# Patient Record
Sex: Male | Born: 1960 | Race: Black or African American | Hispanic: No | Marital: Single | Smoking: Former smoker
Health system: Southern US, Community
[De-identification: ages and names within clinical notes are randomized; demographics above are authoritative.]

## PROBLEM LIST (undated history)

## (undated) DIAGNOSIS — I639 Cerebral infarction, unspecified: Secondary | ICD-10-CM

## (undated) DIAGNOSIS — M1990 Unspecified osteoarthritis, unspecified site: Secondary | ICD-10-CM

## (undated) DIAGNOSIS — K219 Gastro-esophageal reflux disease without esophagitis: Secondary | ICD-10-CM

## (undated) DIAGNOSIS — A159 Respiratory tuberculosis unspecified: Secondary | ICD-10-CM

## (undated) DIAGNOSIS — G8929 Other chronic pain: Secondary | ICD-10-CM

## (undated) DIAGNOSIS — F1910 Other psychoactive substance abuse, uncomplicated: Secondary | ICD-10-CM

## (undated) DIAGNOSIS — I10 Essential (primary) hypertension: Secondary | ICD-10-CM

## (undated) DIAGNOSIS — E559 Vitamin D deficiency, unspecified: Secondary | ICD-10-CM

## (undated) DIAGNOSIS — E785 Hyperlipidemia, unspecified: Secondary | ICD-10-CM

## (undated) HISTORY — DX: Gastro-esophageal reflux disease without esophagitis: K21.9

## (undated) HISTORY — DX: Unspecified osteoarthritis, unspecified site: M19.90

## (undated) HISTORY — DX: Respiratory tuberculosis unspecified: A15.9

## (undated) HISTORY — DX: Other psychoactive substance abuse, uncomplicated: F19.10

## (undated) HISTORY — DX: Other chronic pain: G89.29

## (undated) HISTORY — DX: Cerebral infarction, unspecified: I63.9

## (undated) HISTORY — PX: COLONOSCOPY: SHX174

---
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
## 1999-12-02 HISTORY — PX: ANKLE FRACTURE SURGERY: SHX122

## 2016-02-18 ENCOUNTER — Encounter: Payer: Self-pay | Admitting: Family Medicine

## 2016-02-18 ENCOUNTER — Ambulatory Visit (INDEPENDENT_AMBULATORY_CARE_PROVIDER_SITE_OTHER): Payer: Medicare Other | Admitting: Family Medicine

## 2016-02-18 VITALS — BP 120/75 | HR 59 | Temp 97.7°F | Ht 72.0 in | Wt 183.0 lb

## 2016-02-18 DIAGNOSIS — M25579 Pain in unspecified ankle and joints of unspecified foot: Secondary | ICD-10-CM | POA: Insufficient documentation

## 2016-02-18 DIAGNOSIS — Z1211 Encounter for screening for malignant neoplasm of colon: Secondary | ICD-10-CM | POA: Diagnosis not present

## 2016-02-18 DIAGNOSIS — Z0000 Encounter for general adult medical examination without abnormal findings: Secondary | ICD-10-CM | POA: Diagnosis not present

## 2016-02-18 DIAGNOSIS — M25572 Pain in left ankle and joints of left foot: Secondary | ICD-10-CM | POA: Diagnosis not present

## 2016-02-18 DIAGNOSIS — Z7689 Persons encountering health services in other specified circumstances: Secondary | ICD-10-CM

## 2016-02-18 DIAGNOSIS — Z7189 Other specified counseling: Secondary | ICD-10-CM | POA: Diagnosis not present

## 2016-02-18 LAB — LIPID PANEL
Cholesterol: 200 mg/dL (ref 125–200)
HDL: 43 mg/dL (ref >=40)
LDL CALC: 135 mg/dL — AB (ref <130)
TRIGLYCERIDES: 109 mg/dL (ref <150)
Total CHOL/HDL Ratio: 4.7 Ratio (ref <=5.0)
VLDL: 22 mg/dL (ref <30)

## 2016-02-18 LAB — CBC WITH DIFFERENTIAL/PLATELET
BASOS ABS: 0.1 10*3/uL (ref 0.0–0.1)
Basophils Relative: 2 % — ABNORMAL HIGH (ref 0–1)
Eosinophils Absolute: 0.1 10*3/uL (ref 0.0–0.7)
Eosinophils Relative: 3 % (ref 0–5)
HEMATOCRIT: 42 % (ref 39.0–52.0)
HEMOGLOBIN: 13.7 g/dL (ref 13.0–17.0)
LYMPHS PCT: 49 % — AB (ref 12–46)
Lymphs Abs: 1.7 10*3/uL (ref 0.7–4.0)
MCH: 27.3 pg (ref 26.0–34.0)
MCHC: 32.6 g/dL (ref 30.0–36.0)
MCV: 83.7 fL (ref 78.0–100.0)
MPV: 8.9 fL (ref 8.6–12.4)
Monocytes Absolute: 0.4 10*3/uL (ref 0.1–1.0)
Monocytes Relative: 13 % — ABNORMAL HIGH (ref 3–12)
NEUTROS ABS: 1.1 10*3/uL — AB (ref 1.7–7.7)
Neutrophils Relative %: 33 % — ABNORMAL LOW (ref 43–77)
Platelets: 333 10*3/uL (ref 150–400)
RBC: 5.02 MIL/uL (ref 4.22–5.81)
RDW: 14.7 % (ref 11.5–15.5)
WBC: 3.4 10*3/uL — AB (ref 4.0–10.5)

## 2016-02-18 LAB — COMPLETE METABOLIC PANEL WITH GFR
ALT: 31 U/L (ref 9–46)
AST: 18 U/L (ref 10–35)
Albumin: 4 g/dL (ref 3.6–5.1)
Alkaline Phosphatase: 52 U/L (ref 40–115)
BUN: 11 mg/dL (ref 7–25)
CALCIUM: 9.4 mg/dL (ref 8.6–10.3)
CHLORIDE: 102 mmol/L (ref 98–110)
CO2: 26 mmol/L (ref 20–31)
Creat: 0.88 mg/dL (ref 0.70–1.33)
GFR, Est Non African American: >89 mL/min (ref >=60)
Glucose, Bld: 98 mg/dL (ref 65–99)
POTASSIUM: 4.5 mmol/L (ref 3.5–5.3)
Sodium: 136 mmol/L (ref 135–146)
Total Bilirubin: 0.4 mg/dL (ref 0.2–1.2)
Total Protein: 7.4 g/dL (ref 6.1–8.1)

## 2016-02-18 LAB — TSH: TSH: 0.65 mIU/L (ref 0.40–4.50)
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
## 2016-02-18 NOTE — Patient Instructions (Signed)
We will let you know if anything in your bloodwork needs attention. If kidney function OK will prescribe anti-inflammatory for ankle pain. Watch fat, cholesterol and carbs in diet. Eat plenty of vegetable, fruits and lean meat that is baked, broiled, boiled, or grilled. Try to do some walking each day. Return in 6 months unless we let your know differently

## 2016-02-19 LAB — HIV ANTIBODY (ROUTINE TESTING W REFLEX): HIV 1&2 Ab, 4th Generation: NONREACTIVE

## 2016-02-19 LAB — PSA, MEDICARE: PSA: 1.73 ng/mL (ref <=4.00)

## 2016-02-20 LAB — VITAMIN D 1,25 DIHYDROXY
Vitamin D 1, 25 (OH)2 Total: 54 pg/mL (ref 18–72)
Vitamin D3 1, 25 (OH)2: 54 pg/mL

## 2016-05-27 ENCOUNTER — Emergency Department (HOSPITAL_COMMUNITY)
Admission: EM | Admit: 2016-05-27 | Discharge: 2016-05-27 | Disposition: A | Discharge status: Home / Self Care | Payer: Medicare Other | Attending: Emergency Medicine | Admitting: Emergency Medicine

## 2016-05-27 ENCOUNTER — Emergency Department (HOSPITAL_COMMUNITY): Payer: Medicare Other

## 2016-05-27 ENCOUNTER — Encounter (HOSPITAL_COMMUNITY): Payer: Self-pay

## 2016-05-27 DIAGNOSIS — W28XXXA Contact with powered lawn mower, initial encounter: Secondary | ICD-10-CM | POA: Diagnosis not present

## 2016-05-27 DIAGNOSIS — T148XXA Other injury of unspecified body region, initial encounter: Secondary | ICD-10-CM

## 2016-05-27 DIAGNOSIS — S62632B Displaced fracture of distal phalanx of right middle finger, initial encounter for open fracture: Secondary | ICD-10-CM | POA: Insufficient documentation

## 2016-05-27 DIAGNOSIS — Y9389 Activity, other specified: Secondary | ICD-10-CM | POA: Diagnosis not present

## 2016-05-27 DIAGNOSIS — Y999 Unspecified external cause status: Secondary | ICD-10-CM | POA: Insufficient documentation

## 2016-05-27 DIAGNOSIS — Z87891 Personal history of nicotine dependence: Secondary | ICD-10-CM | POA: Diagnosis not present

## 2016-05-27 DIAGNOSIS — I10 Essential (primary) hypertension: Secondary | ICD-10-CM | POA: Diagnosis not present

## 2016-05-27 DIAGNOSIS — Y929 Unspecified place or not applicable: Secondary | ICD-10-CM | POA: Insufficient documentation

## 2016-05-27 DIAGNOSIS — S6991XA Unspecified injury of right wrist, hand and finger(s), initial encounter: Secondary | ICD-10-CM | POA: Diagnosis present

## 2016-05-27 HISTORY — DX: Essential (primary) hypertension: I10

## 2016-05-27 MED ORDER — TETANUS-DIPHTH-ACELL PERTUSSIS 5-2.5-18.5 LF-MCG/0.5 IM SUSP
0.5000 mL | Freq: Once | INTRAMUSCULAR | Status: AC
  Administered 2016-05-27: 0.5 mL via INTRAMUSCULAR
  Filled 2016-05-27: qty 0.5

## 2016-05-27 MED ORDER — OXYCODONE-ACETAMINOPHEN 5-325 MG PO TABS
2.0000 | ORAL_TABLET | Freq: Once | ORAL | Status: AC
Start: 2016-05-27 — End: 2016-05-27
  Administered 2016-05-27: 2 via ORAL
  Filled 2016-05-27: qty 2

## 2016-05-27 MED ORDER — SULFAMETHOXAZOLE-TRIMETHOPRIM 800-160 MG PO TABS: 1.0000 | ORAL_TABLET | Freq: Two times a day (BID) | ORAL | Status: AC

## 2016-05-27 MED ORDER — LIDOCAINE HCL (PF) 1 % IJ SOLN
30.0000 mL | Freq: Once | INTRAMUSCULAR | Status: AC
  Administered 2016-05-27: 30 mL via INTRADERMAL
  Filled 2016-05-27: qty 30

## 2016-05-27 MED ORDER — HYDROCODONE-ACETAMINOPHEN 5-325 MG PO TABS: 1.0000 | ORAL_TABLET | ORAL | Status: DC | PRN

## 2016-05-27 MED ORDER — BUPIVACAINE HCL (PF) 0.25 % IJ SOLN
15.0000 mL | Freq: Once | INTRAMUSCULAR | Status: AC
  Administered 2016-05-27: 15 mL
  Filled 2016-05-27: qty 15
  Filled 2016-05-27: qty 30

## 2016-05-27 NOTE — ED Provider Notes (Signed)
CSN: [id number]     Arrival date & time 05/27/16  1702 History  By signing my name below, I, Ruben Rivas, attest that this documentation has been prepared under the direction and in the presence of S. Wendie Simmer, PA-C Electronically Signed: [place], ED Scribe. 05/27/2016. 6:16 PM.  Chief Complaint  Patient presents with  . Finger Injury   The history is provided by the patient. No language interpreter was used.   Ruben Rivas is a 55 y.o. male with a medical hx of HTN who presents to the Emergency Department complaining of right middle finger injury occurring PTA. Pt notes that he was cutting his grass when his lawn mower became clogged. Pt attempted to remove the debris with the lawn mower still running when his right middle finger was cut by the blade. Pt rates his right middle finger pain as 6-7/10 at this time. Pt notes that he is not UTD on his tetanus vaccination at this time. Pt is having associated symptoms of right middle finger pain, right middle finger laceration and tingling sensation to right middle finger. He notes that he has tried applying pressure without medications for the relief of his symptoms. He denies numbness and any other symptoms.    Past Medical History  Diagnosis Date  . Hypertension    Past Surgical History  Procedure Laterality Date  . Ankle fracture surgery  2001   Family History  Problem Relation Age of Onset  . Diabetes Mother   . Cancer Father    Social History  Substance Use Topics  . Smoking status: Former Research scientist (life sciences)  . Smokeless tobacco: None  . Alcohol Use: No    Review of Systems  A complete 10 system review of systems was obtained and all systems are negative except as noted in the HPI and PMH.   Allergies  Review of patient's allergies indicates no known allergies.  Home Medications   Prior to Admission medications   Not on File   BP 136/89 mmHg  Pulse 70  Temp(Src) 98.6 F (37 C)  Resp 20  Wt 188 lb 6 oz (85.446 kg)   SpO2 99% Physical Exam  Constitutional: He is oriented to person, place, and time. He appears well-developed and well-nourished. No distress.  HENT:  Head: Normocephalic and atraumatic.  Eyes: EOM are normal.  Neck: Neck supple.  Cardiovascular: Normal rate.   Pulmonary/Chest: Effort normal. No respiratory distress.  Abdominal: He exhibits no distension.  Musculoskeletal: Normal range of motion.       Right hand: He exhibits laceration. He exhibits normal range of motion. Normal sensation noted. Normal strength noted.  Right third digit: Able to flex and extend DIP. Endorses tingling at distal tuft, but endorses intact sensation to lateral and medial aspect of right third digit. See photo below.   Neurological: He is alert and oriented to person, place, and time.  Skin: Skin is warm and dry.  Psychiatric: He has a normal mood and affect. His behavior is normal.  Nursing note and vitals reviewed.       ED Course  Procedures  DIAGNOSTIC STUDIES: Oxygen Saturation is 99% on RA, nl by my interpretation.    COORDINATION OF CARE: 6:16 PM Discussed treatment plan with pt at bedside which includes right hand xray, update tetanus, and consult hand surgeon, and pt agreed to plan.  7:13 PM- consult with Nurse Assistant to hand surgeon, Dr. Fredna Dow who recommends keeping the pt NPO.   8:18 PM- Consult with hand surgeon,  Dr. Fredna Dow, who will see the pt in the ED for evaluation.  Imaging Review Dg Hand Complete Right  05/27/2016  CLINICAL DATA:  Third and fourth digit pain following lawnmower accident, initial encounter EXAM: RIGHT HAND - COMPLETE 3+ VIEW COMPARISON:  None. FINDINGS: Comminuted fracture of the third distal phalanx is noted with associated soft tissue injury. No other focal fracture is seen. No other soft tissue abnormality is noted. IMPRESSION: Fourth distal phalangeal fracture with associated soft tissue defect Electronically Signed   By: Inez Catalina M.D.   On: 05/27/2016 18:34    I have personally reviewed and evaluated these images as part of my medical decision-making. The only exception to the xray report above would be to change "fourth distal phalangeal fracture..." in the impression to third distal.    MDM   Final diagnoses:  Open fracture   Open fracture. Hand surgery consulted and will see patient bedside. Tdap updated.  After evaluation and treatment, Dr. Fredna Dow advises discharge with pain meds and bactrim.   I personally performed the services described in this documentation, which was scribed in my presence. The recorded information has been reviewed and is accurate.  [place], PA-C 06/02/16 [place], MD 06/02/16 1537

## 2016-05-27 NOTE — Discharge Instructions (Signed)
Mr. Santo Hitt,  Nice meeting you! Please follow-up with Dr. Fredna Dow. Return to the emergency department if you develop fevers, chills, nausea/vomiting, increased redness, inability to move your finger, new/worsening symptoms. Feel better soon!  S. Wendie Simmer, PA-C

## 2016-05-27 NOTE — ED Notes (Signed)
Per Pt, Pt was at home mowing the lawn when his lawn mower became clogged. Pt tried to remove the debri when his finger was chopped by the blade. Pt has laceration noted to the right middle finger. Bleeding controlled.

## 2016-05-27 NOTE — ED Notes (Signed)
Dr Kuzma at bedside. 

## 2016-05-27 NOTE — Consult Note (Signed)
Ruben Rivas is an 55 y.o. male.   Chief Complaint: right long fingertip injury HPI: 55 yo rhd male states he was trying to clear material from his lawnmower this evening when he injured his right long finger on the blade.  Seen at [facility] where XR revealed a fracture of the distal phalanx.  He reports no previous injury to the finger and no other injury at this time.  He describes a throbbing, stabbing pain of 6/10 severity.  The injury was associated with bleeding.  The pain is aggravated by palpation and relieved by nothing.   Case discussed with Farrel Gordon, PA-C and her note from 05/27/2016 reviewed. Xrays viewed and interpreted by me: 3 views right long finger show tuft fracture of distal phalanx with displacement.  No radioopaque foreign bodies. Labs reviewed: none  Allergies: No Known Allergies  Past Medical History  Diagnosis Date  . Hypertension     Past Surgical History  Procedure Laterality Date  . Ankle fracture surgery  2001    Family History: Family History  Problem Relation Age of Onset  . Diabetes Mother   . Cancer Father     Social History:   reports that he has quit smoking. He does not have any smokeless tobacco history on file. He reports that he does not drink alcohol or use illicit drugs.  Medications:  (Not in a hospital admission)  No results found for this or any previous visit (from the past 48 hour(s)).  Dg Hand Complete Right  05/27/2016  CLINICAL DATA:  Third and fourth digit pain following lawnmower accident, initial encounter EXAM: RIGHT HAND - COMPLETE 3+ VIEW COMPARISON:  None. FINDINGS: Comminuted fracture of the third distal phalanx is noted with associated soft tissue injury. No other focal fracture is seen. No other soft tissue abnormality is noted. IMPRESSION: Fourth distal phalangeal fracture with associated soft tissue defect Electronically Signed   By: Inez Catalina M.D.   On: 05/27/2016 18:34     A comprehensive review of  systems was negative. Review of Systems: No fevers, chills, night sweats, chest pain, shortness of breath, nausea, vomiting, diarrhea, constipation, easy bleeding or bruising, headaches, dizziness, vision changes, fainting.   Blood pressure 149/92, pulse 55, temperature 98.6 F (37 C), resp. rate 18, weight 85.446 kg (188 lb 6 oz), SpO2 100 %.  General appearance: alert, cooperative and appears stated age Head: Normocephalic, without obvious abnormality, atraumatic Neck: supple, symmetrical, trachea midline Extremities: Intact sensation and capillary refill all digits.  +epl/fpl/io.  Right long finger with laceration from ulnar side of distal phalanx across distal aspect of nail bed.  No other lacerations noted. Pulses: 2+ and symmetric Skin: Skin color, texture, turgor normal. No rashes or lesions Neurologic: Grossly normal Incision/Wound: As above  Assessment/Plan Right long finger open distal phalanx fracture and skin and nail bed lacerations.  Recommend irrigation and debridement, reduction, repair of skin and nail bed in ED.  Risks, benefits, and alternatives of surgery were discussed and the patient voiced understanding and consent and agrees with the plan of care.   PROCEDURE NOTE:  PROCEDURE NOTE:  Digital block performed with 63mL half and half 1% plain lidocaine and 0.25% plain marcaine.  Adequate to give digital anesthesia.  Wound and open fracture copiously irrigated with 1077mL sterile saline and betadine solution.  Finger prepped with betadine and sterilely draped.  Penrose used as a tourniquet and was up ~ 25 minutes.  Remainder of nail removed with freer elevator.  Contaminated hematoma  was removed with the pickups.  Fracture fragments were reduced.  The skin was repaired with 5-0 Monocryl suture and the nail bed repaired with 6-0 chromic suture.  Good approximation achieved.  Xeroform placed in nail fold and wounds dressed with sterile xeroform, 4x4s, and wrapped lightly with  coban dressing.  Alumafoam splint placed and wrapped lightly with coban dressing.  Penrose drain removed.  Patient tolerated procedure well.  Follow up in 1 week.  Pain meds and antibiotic prescribed by ED.   Lutricia Widjaja R 05/27/2016, 9:47 PM

## 2016-08-22 ENCOUNTER — Encounter: Payer: Self-pay | Admitting: Family Medicine

## 2016-08-22 ENCOUNTER — Ambulatory Visit (INDEPENDENT_AMBULATORY_CARE_PROVIDER_SITE_OTHER): Payer: Medicare Other | Admitting: Family Medicine

## 2016-08-22 VITALS — BP 126/82 | HR 59 | Temp 98.4°F | Resp 14 | Ht 72.0 in | Wt 191.0 lb

## 2016-08-22 DIAGNOSIS — Z1159 Encounter for screening for other viral diseases: Secondary | ICD-10-CM

## 2016-08-22 DIAGNOSIS — D72819 Decreased white blood cell count, unspecified: Secondary | ICD-10-CM

## 2016-08-22 DIAGNOSIS — Z1211 Encounter for screening for malignant neoplasm of colon: Secondary | ICD-10-CM | POA: Diagnosis not present

## 2016-08-22 DIAGNOSIS — Z23 Encounter for immunization: Secondary | ICD-10-CM

## 2016-08-22 LAB — CBC WITH DIFFERENTIAL/PLATELET
Basophils Absolute: 41 cells/uL (ref 0–200)
Basophils Relative: 1 %
EOS PCT: 4 %
Eosinophils Absolute: 164 cells/uL (ref 15–500)
HCT: 42.7 % (ref 38.5–50.0)
HEMOGLOBIN: 13.9 g/dL (ref 13.2–17.1)
LYMPHS ABS: 2050 {cells}/uL (ref 850–3900)
Lymphocytes Relative: 50 %
MCH: 26.7 pg — ABNORMAL LOW (ref 27.0–33.0)
MCHC: 32.6 g/dL (ref 32.0–36.0)
MCV: 82 fL (ref 80.0–100.0)
MPV: 9.7 fL (ref 7.5–12.5)
Monocytes Absolute: 697 cells/uL (ref 200–950)
Monocytes Relative: 17 %
NEUTROS ABS: 1148 {cells}/uL — AB (ref 1500–7800)
Neutrophils Relative %: 28 %
Platelets: 268 10*3/uL (ref 140–400)
RBC: 5.21 MIL/uL (ref 4.20–5.80)
RDW: 14.9 % (ref 11.0–15.0)
WBC: 4.1 10*3/uL (ref 3.8–10.8)

## 2016-08-22 NOTE — Progress Notes (Signed)
Ruben Rivas, is a 55 y.o. male  [id number]  [id number]  DOB - 1961/06/01  CC:  Chief Complaint  Patient presents with  . Follow-up       HPI: Nizar Friedrich is a 55 y.o. male here for follow-up hypertension. He is currently on no medications for hypertension. His BP today is126/82. His other complaints is of ankle and foot pain. He reports stopping smoking about 9 months ago, and has no alcohol and drugs in about a year.   Health Maintenance: He accepts flu shot today. . Tdap is UTD. He needs Hep C and colon cancer screening.   No Known Allergies Past Medical History:  Diagnosis Date  . Hypertension    No current outpatient prescriptions on file prior to visit.   No current facility-administered medications on file prior to visit.    Family History  Problem Relation Age of Onset  . Diabetes Mother   . Cancer Father    Social History   Social History  . Marital status: Single    Spouse name: N/A  . Number of children: N/A  . Years of education: N/A   Occupational History  . Not on file.   Social History Main Topics  . Smoking status: Former Research scientist (life sciences)  . Smokeless tobacco: Never Used  . Alcohol use No  . Drug use: No  . Sexual activity: Not on file   Other Topics Concern  . Not on file   Social History Narrative  . No narrative on file    Review of Systems: Constitutional: Negative Skin: Negative HENT: Negative  Eyes: Negative  Neck: Negative Respiratory: Negative Cardiovascular: Negative Gastrointestinal: Negative Genitourinary: Negative  Musculoskeletal: Positive for back and ankle pain.  Neurological: Negative  Hematological: Negative  Psychiatric/Behavioral: Negative    Objective:   Vitals:   08/22/16 1035  BP: 126/82  Pulse: (!) 59  Resp: 14  Temp: 98.4 F (36.9 C)    Physical Exam: Constitutional: Patient appears well-developed and well-nourished. No distress. HENT: Normocephalic, atraumatic, External right and left  ear normal. Oropharynx is clear and moist. Teeth in poor repair Eyes: Conjunctivae and EOM are normal. PERRLA, no scleral icterus. Neck: Normal ROM. Neck supple. No lymphadenopathy, No thyromegaly. CVS: RRR, S1/S2 +, no murmurs, no gallops, no rubs Pulmonary: Effort and breath sounds normal, no stridor, rhonchi, wheezes, rales.  Abdominal: Soft. Normoactive BS,, no distension, tenderness, rebound or guarding.  Musculoskeletal: Normal range of motion. No edema and no tenderness.  Neuro: Alert.Normal muscle tone coordination. Non-focal Skin: Skin is warm and dry. No rash noted. Not diaphoretic. No erythema. No pallor. Psychiatric: Normal mood and affect. Behavior, judgment, thought content normal.  Lab Results  Component Value Date   WBC 3.4 (L) 02/18/2016   HGB 13.7 02/18/2016   HCT 42.0 02/18/2016   MCV 83.7 02/18/2016   PLT 333 02/18/2016   Lab Results  Component Value Date   CREATININE 0.88 02/18/2016   BUN 11 02/18/2016   NA 136 02/18/2016   K 4.5 02/18/2016   CL 102 02/18/2016   CO2 26 02/18/2016    No results found for: HGBA1C Lipid Panel     Component Value Date/Time   CHOL 200 02/18/2016 0916   TRIG 109 02/18/2016 0916   HDL 43 02/18/2016 0916   CHOLHDL 4.7 02/18/2016 0916   VLDL 22 02/18/2016 0916   LDLCALC 135 (H) 02/18/2016 0916       Assessment and plan:   1. Need for prophylactic vaccination and inoculation against  influenza  - Flu Vaccine QUAD 36+ mos PF IM (Fluarix & Fluzone Quad PF)  2. Leukopenia  - CBC with Differential  3. Need for hepatitis C screening test  - Hepatitis C Antibody  4. Colon cancer screening  - Ambulatory referral to Gastroenterology   Return in about 6 months (around 02/19/2017).  The patient was given clear instructions to go to ER or return to medical center if symptoms don't improve, worsen or new problems develop. The patient verbalized understanding.    Micheline Chapman FNP  08/22/2016, 4:01 PM

## 2016-08-23 LAB — HEPATITIS C ANTIBODY: HCV AB: NEGATIVE

## 2016-10-28 ENCOUNTER — Encounter: Payer: Self-pay | Admitting: Family Medicine

## 2017-02-23 ENCOUNTER — Ambulatory Visit: Payer: Self-pay | Admitting: Family Medicine

## 2017-02-23 ENCOUNTER — Ambulatory Visit (INDEPENDENT_AMBULATORY_CARE_PROVIDER_SITE_OTHER): Payer: Medicare Other | Admitting: Family Medicine

## 2017-02-23 ENCOUNTER — Encounter: Payer: Self-pay | Admitting: Family Medicine

## 2017-02-23 VITALS — BP 126/90 | HR 81 | Temp 98.6°F | Ht 72.0 in | Wt 189.0 lb

## 2017-02-23 DIAGNOSIS — Z0000 Encounter for general adult medical examination without abnormal findings: Secondary | ICD-10-CM | POA: Diagnosis not present

## 2017-02-23 DIAGNOSIS — R5383 Other fatigue: Secondary | ICD-10-CM | POA: Diagnosis not present

## 2017-02-23 DIAGNOSIS — Z1329 Encounter for screening for other suspected endocrine disorder: Secondary | ICD-10-CM

## 2017-02-23 DIAGNOSIS — Z1211 Encounter for screening for malignant neoplasm of colon: Secondary | ICD-10-CM | POA: Diagnosis not present

## 2017-02-23 DIAGNOSIS — Z125 Encounter for screening for malignant neoplasm of prostate: Secondary | ICD-10-CM

## 2017-02-23 DIAGNOSIS — Z0279 Encounter for issue of other medical certificate: Secondary | ICD-10-CM

## 2017-02-23 LAB — CBC
HCT: 44.4 % (ref 38.5–50.0)
Hemoglobin: 14.9 g/dL (ref 13.2–17.1)
MCH: 27.6 pg (ref 27.0–33.0)
MCHC: 33.6 g/dL (ref 32.0–36.0)
MCV: 82.2 fL (ref 80.0–100.0)
MPV: 10 fL (ref 7.5–12.5)
PLATELETS: 352 10*3/uL (ref 140–400)
RBC: 5.4 MIL/uL (ref 4.20–5.80)
RDW: 14.5 % (ref 11.0–15.0)
WBC: 4.3 10*3/uL (ref 3.8–10.8)

## 2017-02-23 NOTE — Progress Notes (Signed)
Patient ID: Jeramiah Mccaughey, male    DOB: [birth year], 56 y.o.   MRN: [id number]  PCP: Molli Barrows, FNP  Chief Complaint  Patient presents with  . Follow-up    6 MONTH/ NOTE STATING THAT HE CAN HANDLE HIS AFFAIRS    Subjective:  HPI Neiko Trivedi is a 56 y.o. male presents to establish care and his routine 6 month follow-up. Past medical alcohol and substance abuse addiction. Although he reports complete sobriety over the last couple of years. He is established here in [facility], reports that he has his own residence, works part-time, and attends church regularly. He denies use of cigarettes or alcohol. Warnell reports that during the time he was approved for disability he was still using drugs and was not stable enough to manage his money. His supervisor at the time agreed to ne his payee.  His payee lives in [place] and is the owner of [employer]. He reports that it is a great inconvenience for his payee to travel here to [facility] for drivability evaluations and therefore he and his payee is requesting that Mr.Oviatt now have complete control of his disability funds. He denies any physical or psychological complaints today with exception of fatigue occasionally. Kameran reports that he did attempt to schedule his colonoscopy however since he relies on public transportation gastroenterology is unable to schedule the procedure. He is interested in Cologuard if this screening is covered by his insurance.    Social History   Social History  . Marital status: Single    Spouse name: N/A  . Number of children: N/A  . Years of education: N/A   Occupational History  . Not on file.   Social History Main Topics  . Smoking status: Former Research scientist (life sciences)  . Smokeless tobacco: Never Used  . Alcohol use No  . Drug use: No  . Sexual activity: Not on file   Other Topics Concern  . Not on file   Social History Narrative  . No narrative on file    Family History  Problem  Relation Age of Onset  . Diabetes Mother   . Cancer Father    Review of Systems See HPI  Patient Active Problem List   Diagnosis Date Noted  . Pain in joint, ankle and foot 02/18/2016    No Known Allergies   Objective:   Today's Vitals   02/23/17 1522  BP: 126/90  Pulse: 81  Temp: 98.6 F (37 C)  TempSrc: Oral  SpO2: 100%  Weight: 189 lb (85.7 kg)  Height: 6' (1.829 m)    Wt Readings from Last 3 Encounters:  02/23/17 189 lb (85.7 kg)  08/22/16 191 lb (86.6 kg)  05/27/16 188 lb 6 oz (85.4 kg)   Physical Exam  Constitutional: He appears well-developed and well-nourished.  HENT:  Head: Normocephalic and atraumatic.  Eyes: Conjunctivae and EOM are normal. Pupils are equal, round, and reactive to light.  Neck: Normal range of motion. Neck supple. No thyromegaly present.  Cardiovascular: Normal rate, normal heart sounds and intact distal pulses.   Pulmonary/Chest: Effort normal and breath sounds normal.  Abdominal: Soft. Bowel sounds are normal. He exhibits no distension and no mass. There is no tenderness. There is no rebound and no guarding.  Musculoskeletal: Normal range of motion.  Lymphadenopathy:    He has no cervical adenopathy.  Neurological: He is alert.  Skin: Skin is warm and dry.  Psychiatric: He has a normal mood and affect. His behavior is normal. Judgment and  thought content normal.  Vitals reviewed.    Assessment & Plan:  1. Encounter for Medicare annual wellness exam Age appropriate anticipatory guidance provided.  2. Encounter for evaluation of ability to make decisions regarding care -Patient present both mentally and physically stable.  -I will complete a letter of recommending patient be permitted to manage finances.  3. Screening for thyroid disorder  - Thyroid Panel With TSH  4. Fatigue, unspecified type - CBC - Basic metabolic panel  6. Screen for colon cancer -At present our office is not signed up to offer Cologuard.  I will  investigate further and notify patient once this screening is available to offer within our practice.  Completed letter and left at front desk for patient to pick-up.    Carroll Sage. Kenton Kingfisher, MSN, FNP-C [facility] [place]

## 2017-02-24 ENCOUNTER — Ambulatory Visit: Payer: Self-pay | Admitting: Family Medicine

## 2017-02-24 ENCOUNTER — Encounter: Payer: Self-pay | Admitting: Family Medicine

## 2017-02-24 LAB — BASIC METABOLIC PANEL
BUN: 11 mg/dL (ref 7–25)
CALCIUM: 9.5 mg/dL (ref 8.6–10.3)
CHLORIDE: 101 mmol/L (ref 98–110)
CO2: 29 mmol/L (ref 20–31)
CREATININE: 0.94 mg/dL (ref 0.70–1.33)
Glucose, Bld: 89 mg/dL (ref 65–99)
Potassium: 4.7 mmol/L (ref 3.5–5.3)
SODIUM: 137 mmol/L (ref 135–146)

## 2017-02-24 LAB — THYROID PANEL WITH TSH
FREE THYROXINE INDEX: 2.6 (ref 1.4–3.8)
T3 UPTAKE: 32 % (ref 22–35)
T4, Total: 8.2 ug/dL (ref 4.5–12.0)
TSH: 0.98 m[IU]/L (ref 0.40–4.50)

## 2017-02-24 NOTE — Progress Notes (Signed)
Please mail letter to patient  

## 2017-02-25 ENCOUNTER — Telehealth: Payer: Self-pay | Admitting: Family Medicine

## 2017-02-25 NOTE — Telephone Encounter (Signed)
Letter to social security has been completed. Left up front for pick up.

## 2017-04-24 ENCOUNTER — Encounter: Payer: Self-pay | Admitting: Family Medicine

## 2017-04-24 ENCOUNTER — Ambulatory Visit (INDEPENDENT_AMBULATORY_CARE_PROVIDER_SITE_OTHER): Payer: Medicare Other | Admitting: Family Medicine

## 2017-04-24 ENCOUNTER — Telehealth: Payer: Self-pay

## 2017-04-24 VITALS — BP 138/88 | HR 72 | Temp 97.8°F | Resp 16 | Ht 72.0 in | Wt 177.0 lb

## 2017-04-24 DIAGNOSIS — Z1389 Encounter for screening for other disorder: Secondary | ICD-10-CM

## 2017-04-24 DIAGNOSIS — I781 Nevus, non-neoplastic: Secondary | ICD-10-CM | POA: Diagnosis not present

## 2017-04-24 NOTE — Progress Notes (Signed)
   Patient ID: Ruben Rivas, male    DOB: 02-Jul-1961, 56 y.o.   MRN: [id number]  PCP: Ruben Jun, FNP  Chief Complaint  Patient presents with  . Nevus    ON FACE    Subjective:  HPI  Ruben Rivas is a 56 y.o. male presents for evaluation of discolored mole on right cheek bone of face x 2 weeks. Ruben Rivas has several nevi generalized throughout face. Nevi turned completely white. Nonpainful and non exudative. Patient reports that he is covered with moles and have never experienced one changing color of  shape in the past therefore he is rather concern. Denies any known family history of skin cancer.   Social History   Social History  . Marital status: Single    Spouse name: N/A  . Number of children: N/A  . Years of education: N/A   Occupational History  . Not on file.   Social History Main Topics  . Smoking status: Former Research scientist (life sciences)  . Smokeless tobacco: Never Used  . Alcohol use No  . Drug use: No  . Sexual activity: Not on file   Other Topics Concern  . Not on file   Social History Narrative  . No narrative on file    Family History  Problem Relation Age of Onset  . Diabetes Mother   . Cancer Father    Review of Systems See HPI   Patient Active Problem List   Diagnosis Date Noted  . Pain in joint, ankle and foot 02/18/2016    No Known Allergies  Prior to Admission medications   Not on File    Past Medical, Surgical Family and Social History reviewed and updated.    Objective:   Today's Vitals   04/24/17 0940  BP: 138/88  Pulse: 72  Resp: 16  Temp: 97.8 F (36.6 C)  TempSrc: Oral  SpO2: 100%  Weight: 177 lb (80.3 kg)  Height: 6' (1.829 m)    Wt Readings from Last 3 Encounters:  04/24/17 177 lb (80.3 kg)  02/23/17 189 lb (85.7 kg)  08/22/16 191 lb (86.6 kg)   Physical Exam  Constitutional: He is oriented to person, place, and time. He appears well-developed and well-nourished.  HENT:  Head: Normocephalic.    Eyes:  Pupils are equal, round, and reactive to light.  Neck: Neck supple.  Cardiovascular: Normal rate, regular rhythm and normal heart sounds.   Pulmonary/Chest: Effort normal and breath sounds normal.  Neurological: He is alert and oriented to person, place, and time.  Skin: Skin is warm and dry.  Psychiatric: He has a normal mood and affect. Judgment and thought content normal.     Assessment & Plan:  1. Encounter for surveillance of abnormal nevi - Ambulatory referral to Dermatology -Avoid picking at mole or applying any ointments or creams.  RTC: Follow-up is already on file 08/27/2017  Carroll Sage. Kenton Kingfisher, MSN, FNP-C [facility] [place]  [address] [place], [place] [address] [phone]

## 2017-04-24 NOTE — Telephone Encounter (Signed)
[facility] doesn't have a appointment until July 11. I called [facility] and left a vm for them to call me on Tuesday to find out if they van't get patient scheduled for appointment.

## 2017-04-24 NOTE — Telephone Encounter (Signed)
-----   Message from Scot Jun, [place] sent at 04/24/2017  3:02 PM EDT ----- Please see if we can get patient worked in on Thursday or Friday with [facility]  [phone]

## 2017-04-29 NOTE — Telephone Encounter (Signed)
-----   Message from Scot Jun, [place] sent at 04/24/2017  3:02 PM EDT ----- Please see if we can get patient worked in on Thursday or Friday with [facility]  [phone]

## 2017-04-29 NOTE — Telephone Encounter (Signed)
Got patient scheduled at [facility] on 05/18/2017 at 9:20am.

## 2017-04-29 NOTE — Telephone Encounter (Signed)
-----   Message from Scot Jun, [place] sent at 04/24/2017  3:02 PM EDT ----- Please see if we can get patient worked in on Thursday or Friday with [facility]  [phone]

## 2017-04-29 NOTE — Telephone Encounter (Signed)
-----   Message from Scot Jun, [place] sent at 04/28/2017  5:27 PM EDT ----- Please follow-up on referrals for patient.  Follow-up with [facility] and or contact the practice below    [facility] [place] [phone]

## 2017-04-29 NOTE — Telephone Encounter (Signed)
[facility] does not take patient insurance

## 2017-04-29 NOTE — Telephone Encounter (Signed)
Tried to call patient no answer and no vm set up 

## 2017-04-30 NOTE — Telephone Encounter (Signed)
-----   Message from Scot Jun, [place] sent at 04/28/2017  5:27 PM EDT ----- Please follow-up on referrals for patient.  Follow-up with Allyson Sabal and or contact the practice below    [facility] [place] [phone]

## 2017-04-30 NOTE — Telephone Encounter (Signed)
Patient notified of appointment.  

## 2017-06-23 DIAGNOSIS — L918 Other hypertrophic disorders of the skin: Secondary | ICD-10-CM | POA: Diagnosis not present

## 2017-06-23 DIAGNOSIS — B079 Viral wart, unspecified: Secondary | ICD-10-CM | POA: Diagnosis not present

## 2017-06-23 DIAGNOSIS — D485 Neoplasm of uncertain behavior of skin: Secondary | ICD-10-CM | POA: Diagnosis not present

## 2017-06-23 DIAGNOSIS — L821 Other seborrheic keratosis: Secondary | ICD-10-CM | POA: Diagnosis not present

## 2017-08-27 ENCOUNTER — Ambulatory Visit: Payer: Self-pay | Admitting: Family Medicine

## 2017-10-05 DIAGNOSIS — Z23 Encounter for immunization: Secondary | ICD-10-CM | POA: Diagnosis not present

## 2018-03-02 ENCOUNTER — Ambulatory Visit (INDEPENDENT_AMBULATORY_CARE_PROVIDER_SITE_OTHER): Payer: Medicare Other | Admitting: Family Medicine

## 2018-03-02 ENCOUNTER — Ambulatory Visit: Payer: Self-pay | Admitting: Family Medicine

## 2018-03-02 ENCOUNTER — Encounter: Payer: Self-pay | Admitting: Family Medicine

## 2018-03-02 VITALS — BP 140/86 | HR 60 | Temp 98.2°F | Ht 72.0 in | Wt 187.8 lb

## 2018-03-02 DIAGNOSIS — Z1211 Encounter for screening for malignant neoplasm of colon: Secondary | ICD-10-CM | POA: Diagnosis not present

## 2018-03-02 DIAGNOSIS — Z130 Encounter for screening for diseases of the blood and blood-forming organs and certain disorders involving the immune mechanism: Secondary | ICD-10-CM

## 2018-03-02 DIAGNOSIS — R339 Retention of urine, unspecified: Secondary | ICD-10-CM

## 2018-03-02 DIAGNOSIS — Z1329 Encounter for screening for other suspected endocrine disorder: Secondary | ICD-10-CM

## 2018-03-02 DIAGNOSIS — R7309 Other abnormal glucose: Secondary | ICD-10-CM | POA: Diagnosis not present

## 2018-03-02 DIAGNOSIS — Z136 Encounter for screening for cardiovascular disorders: Secondary | ICD-10-CM

## 2018-03-02 LAB — POCT URINALYSIS DIP (DEVICE)
Bilirubin Urine: NEGATIVE
Glucose, UA: NEGATIVE mg/dL
Hgb urine dipstick: NEGATIVE
Ketones, ur: NEGATIVE mg/dL
Leukocytes, UA: NEGATIVE
Nitrite: NEGATIVE
Protein, ur: NEGATIVE mg/dL
Specific Gravity, Urine: 1.02 (ref 1.005–1.030)
Urobilinogen, UA: 1 mg/dL (ref 0.0–1.0)
pH: 7 (ref 5.0–8.0)

## 2018-03-02 LAB — POCT GLYCOSYLATED HEMOGLOBIN (HGB A1C): Hemoglobin A1C: 5.2

## 2018-03-02 NOTE — Patient Instructions (Addendum)

## 2018-03-03 ENCOUNTER — Ambulatory Visit: Payer: Self-pay | Admitting: Family Medicine

## 2018-03-03 LAB — CBC WITH DIFFERENTIAL/PLATELET
BASOS ABS: 0 10*3/uL (ref 0.0–0.2)
Basos: 1 %
EOS (ABSOLUTE): 0.1 10*3/uL (ref 0.0–0.4)
Eos: 3 %
Hematocrit: 42.1 % (ref 37.5–51.0)
Hemoglobin: 13.8 g/dL (ref 13.0–17.7)
Immature Grans (Abs): 0 10*3/uL (ref 0.0–0.1)
Immature Granulocytes: 0 %
LYMPHS ABS: 2 10*3/uL (ref 0.7–3.1)
Lymphs: 44 %
MCH: 27.4 pg (ref 26.6–33.0)
MCHC: 32.8 g/dL (ref 31.5–35.7)
MCV: 84 fL (ref 79–97)
MONOS ABS: 0.6 10*3/uL (ref 0.1–0.9)
Monocytes: 14 %
Neutrophils Absolute: 1.7 10*3/uL (ref 1.4–7.0)
Neutrophils: 38 %
Platelets: 288 10*3/uL (ref 150–379)
RBC: 5.04 x10E6/uL (ref 4.14–5.80)
RDW: 14.2 % (ref 12.3–15.4)
WBC: 4.5 10*3/uL (ref 3.4–10.8)

## 2018-03-03 LAB — COMPREHENSIVE METABOLIC PANEL
ALK PHOS: 68 IU/L (ref 39–117)
ALT: 28 IU/L (ref 0–44)
AST: 27 IU/L (ref 0–40)
Albumin/Globulin Ratio: 1.5 (ref 1.2–2.2)
Albumin: 4.4 g/dL (ref 3.5–5.5)
BILIRUBIN TOTAL: 0.3 mg/dL (ref 0.0–1.2)
BUN/Creatinine Ratio: 16 (ref 9–20)
BUN: 12 mg/dL (ref 6–24)
CHLORIDE: 100 mmol/L (ref 96–106)
CO2: 23 mmol/L (ref 20–29)
CREATININE: 0.77 mg/dL (ref 0.76–1.27)
Calcium: 9.5 mg/dL (ref 8.7–10.2)
GFR calc Af Amer: 117 mL/min/{1.73_m2} (ref >59)
GFR calc non Af Amer: 101 mL/min/{1.73_m2} (ref >59)
GLUCOSE: 91 mg/dL (ref 65–99)
Globulin, Total: 3 g/dL (ref 1.5–4.5)
Potassium: 4.5 mmol/L (ref 3.5–5.2)
SODIUM: 139 mmol/L (ref 134–144)
Total Protein: 7.4 g/dL (ref 6.0–8.5)

## 2018-03-03 LAB — THYROID PANEL WITH TSH
Free Thyroxine Index: 2.2 (ref 1.2–4.9)
T3 Uptake Ratio: 29 % (ref 24–39)
T4 TOTAL: 7.5 ug/dL (ref 4.5–12.0)
TSH: 1.18 u[IU]/mL (ref 0.450–4.500)

## 2018-03-03 LAB — PSA: PROSTATE SPECIFIC AG, SERUM: 2.8 ng/mL (ref 0.0–4.0)

## 2018-03-04 NOTE — Progress Notes (Signed)
Patient ID: Brace Welte, male    DOB: 1960/12/23, 57 y.o.   MRN: [id number]  PCP: Scot Jun, FNP  Chief Complaint  Patient presents with  . Follow-up    6 month routine care    Subjective:  HPI  Laurent Cargile is a 57 y.o. male presents for 6 month routine care follow up. He denies any physical complaints. He denies any changes in vision, dizziness, or lightheadedness.He denies any chest pain, tightness or palpitations. He denies any shortness of breath.   Social History   Socioeconomic History  . Marital status: Single    Spouse name: Not on file  . Number of children: Not on file  . Years of education: Not on file  . Highest education level: Not on file  Occupational History  . Not on file  Social Needs  . Financial resource strain: Not on file  . Food insecurity:    Worry: Not on file    Inability: Not on file  . Transportation needs:    Medical: Not on file    Non-medical: Not on file  Tobacco Use  . Smoking status: Former Research scientist (life sciences)  . Smokeless tobacco: Never Used  Substance and Sexual Activity  . Alcohol use: No    Alcohol/week: 0.0 oz  . Drug use: No  . Sexual activity: Not on file  Lifestyle  . Physical activity:    Days per week: Not on file    Minutes per session: Not on file  . Stress: Not on file  Relationships  . Social connections:    Talks on phone: Not on file    Gets together: Not on file    Attends religious service: Not on file    Active member of club or organization: Not on file    Attends meetings of clubs or organizations: Not on file    Relationship status: Not on file  . Intimate partner violence:    Fear of current or ex partner: Not on file    Emotionally abused: Not on file    Physically abused: Not on file    Forced sexual activity: Not on file  Other Topics Concern  . Not on file  Social History Narrative  . Not on file    Family History  Problem Relation Age of Onset  . Diabetes Mother   . Cancer Father       Review of Systems  Constitutional: Negative.   HENT: Negative.   Eyes: Negative.   Respiratory: Negative.   Cardiovascular: Negative.   Gastrointestinal: Negative.   Endocrine: Negative.   Genitourinary: Positive for difficulty urinating. Negative for dysuria, enuresis, flank pain, frequency and hematuria.  Musculoskeletal: Positive for arthralgias.       Old left ankle injury with occasional discomfort  Skin: Negative.   Neurological: Negative.   Psychiatric/Behavioral: Negative.     Patient Active Problem List   Diagnosis Date Noted  . Pain in joint, ankle and foot 02/18/2016    No Known Allergies  Prior to Admission medications   Not on File    Past Medical, Surgical Family and Social History reviewed and updated.    Objective:   Today's Vitals   03/02/18 1009  BP: 140/86  Pulse: 60  Temp: 98.2 F (36.8 C)  TempSrc: Oral  SpO2: 98%  Weight: 187 lb 12.8 oz (85.2 kg)  Height: 6' (1.829 m)    Wt Readings from Last 3 Encounters:  03/02/18 187 lb 12.8 oz (85.2 kg)  04/24/17 177  lb (80.3 kg)  02/23/17 189 lb (85.7 kg)    Physical Exam  Constitutional: He is oriented to person, place, and time. He appears well-developed and well-nourished.  HENT:  Head: Normocephalic and atraumatic.  Eyes: Pupils are equal, round, and reactive to light. Conjunctivae and EOM are normal.  Neck: Normal range of motion. Neck supple.  Cardiovascular: Normal rate, regular rhythm and normal heart sounds.  Pulmonary/Chest: Effort normal and breath sounds normal.  Abdominal: Soft. Bowel sounds are normal.  Musculoskeletal: Normal range of motion.  Neurological: He is alert and oriented to person, place, and time.  Skin: Skin is warm and dry.  Psychiatric: He has a normal mood and affect. His behavior is normal. Judgment and thought content normal.           Assessment & Plan:  1. Elevated glucose - POCT glycosylated hemoglobin (Hb A1C) - POCT urinalysis dip  (device)  2. Urinary retention - Comprehensive metabolic panel - PSA  3. Screening for thyroid disorder - Thyroid Panel With TSH  4. Screening, anemia, deficiency, iron  - CBC with Differential  5. Screening for cardiovascular condition  - EKG 12-Lead  6. Screen for colon cancer  - Ambulatory referral to Gastroenterology  Educated on cologuard due to no positive family history of Cancer, pt declines option for now   If symptoms worsen or do not improve, return for follow-up, follow-up with PCP, or at the emergency department if severity of symptoms warrant a higher level of care. Festus Aloe, FNP Student

## 2018-03-12 ENCOUNTER — Telehealth: Payer: Self-pay

## 2018-03-12 ENCOUNTER — Encounter: Payer: Self-pay | Admitting: Gastroenterology

## 2018-03-12 NOTE — Telephone Encounter (Signed)
Patient notified

## 2018-03-16 NOTE — Progress Notes (Signed)
Patient ID: Nylen Creque, male    DOB: 04-19-1961, 57 y.o.   MRN: [id number]  PCP: Scot Jun, FNP  Chief Complaint  Patient presents with  . Follow-up    6 month routine care    Subjective:  HPI Rajon Bisig is a 57 y.o. male presents for routine six month wellness visit. Damyan has no significant medical problems. He would like to address routine health maintenance today. He is interested in obtaining colonoscopy as he has never had one previously. To his knowledge, he has no family history of colon cancer in his family. Shyam last PSA screening was in 2017 and level was normal. He request to have his PSA level rechecked today. Reports an extended family member had prostate cancer although no first degree relatives. He is doing well overall. Continue to be active daily. He is a non-smoker. Denies experiencing any shortness of breath, chest pain, weakness, headaches, or dizziness. Social History   Socioeconomic History  . Marital status: Single    Spouse name: Not on file  . Number of children: Not on file  . Years of education: Not on file  . Highest education level: Not on file  Occupational History  . Not on file  Social Needs  . Financial resource strain: Not on file  . Food insecurity:    Worry: Not on file    Inability: Not on file  . Transportation needs:    Medical: Not on file    Non-medical: Not on file  Tobacco Use  . Smoking status: Former Research scientist (life sciences)  . Smokeless tobacco: Never Used  Substance and Sexual Activity  . Alcohol use: No    Alcohol/week: 0.0 oz  . Drug use: No  . Sexual activity: Not on file  Lifestyle  . Physical activity:    Days per week: Not on file    Minutes per session: Not on file  . Stress: Not on file  Relationships  . Social connections:    Talks on phone: Not on file    Gets together: Not on file    Attends religious service: Not on file    Active member of club or organization: Not on file    Attends meetings of  clubs or organizations: Not on file    Relationship status: Not on file  . Intimate partner violence:    Fear of current or ex partner: Not on file    Emotionally abused: Not on file    Physically abused: Not on file    Forced sexual activity: Not on file  Other Topics Concern  . Not on file  Social History Narrative  . Not on file    Family History  Problem Relation Age of Onset  . Diabetes Mother   . Cancer Father    Review of Systems  Pertinent negatives listed in HPI  Patient Active Problem List   Diagnosis Date Noted  . Pain in joint, ankle and foot 02/18/2016    No Known Allergies  Prior to Admission medications   Not on File    Past Medical, Surgical Family and Social History reviewed and updated.    Objective:   Today's Vitals   03/02/18 1009  BP: 140/86  Pulse: 60  Temp: 98.2 F (36.8 C)  TempSrc: Oral  SpO2: 98%  Weight: 187 lb 12.8 oz (85.2 kg)  Height: 6' (1.829 m)    Wt Readings from Last 3 Encounters:  03/02/18 187 lb 12.8 oz (85.2 kg)  04/24/17 177  lb (80.3 kg)  02/23/17 189 lb (85.7 kg)    Physical Exam Constitutional: Patient appears well-developed and well-nourished. No distress. HENT: Normocephalic, atraumatic, External right and left ear normal. Oropharynx is clear and moist.  Eyes: Conjunctivae and EOM are normal. PERRLA, no scleral icterus. Neck: Normal ROM. Neck supple. No JVD. No tracheal deviation. No thyromegaly. CVS: RRR, S1/S2 +, no murmurs, no gallops, no carotid bruit.  Pulmonary: Effort and breath sounds normal, no stridor, rhonchi, wheezes, rales.  Abdominal: Soft. BS +, no distension, tenderness, rebound or guarding.  Musculoskeletal: Normal range of motion. No edema and no tenderness.  Lymphadenopathy: No cervical lymphadenopathy noted. Neuro: Alert. Normal reflexes, muscle tone coordination. No cranial nerve deficit. Skin: Skin is warm and dry. No rash noted. Not diaphoretic. No erythema. No pallor. Psychiatric:  Normal mood and affect. Behavior, judgment, thought content normal.   Assessment & Plan:  1. Elevated glucose, A1C today 5.2, normal. Repeat in 12 months.  2. Urinary retention, check PSA level and CMP to evaluate renal function   3. Screening for thyroid disorder, check thyroid panel with TSH  4. Screening, anemia, deficiency, iron, CBC with Differential  5. Screening for cardiovascular condition, Sinus Bradycardia, negative of ischemic changes or hypertrophy.  6. Screen for colon cancer, Offered Cologard. Patient declined and prefers routine colonscopy screening. Referring  to Gastroenterology  RTC: 12 months for CPE or sooner if needed  Carroll Sage. Kenton Kingfisher, MSN, FNP-C [facility] [place]  [address] [place], [place] [address] [phone]

## 2018-03-16 NOTE — Progress Notes (Deleted)
Patient ID: Ruben Rivas, male    DOB: 1961/10/14, 57 y.o.   MRN: [id number]  PCP: Scot Jun, FNP  Chief Complaint  Patient presents with  . Follow-up    6 month routine care    Subjective:  HPI  Ruben Rivas is a 57 y.o. male presents for 6 month routine care follow up. He denies any physical complaints. He denies any changes in vision, dizziness, or lightheadedness.He denies any chest pain, tightness or palpitations. He denies any shortness of breath.   Social History   Socioeconomic History  . Marital status: Single    Spouse name: Not on file  . Number of children: Not on file  . Years of education: Not on file  . Highest education level: Not on file  Occupational History  . Not on file  Social Needs  . Financial resource strain: Not on file  . Food insecurity:    Worry: Not on file    Inability: Not on file  . Transportation needs:    Medical: Not on file    Non-medical: Not on file  Tobacco Use  . Smoking status: Former Research scientist (life sciences)  . Smokeless tobacco: Never Used  Substance and Sexual Activity  . Alcohol use: No    Alcohol/week: 0.0 oz  . Drug use: No  . Sexual activity: Not on file  Lifestyle  . Physical activity:    Days per week: Not on file    Minutes per session: Not on file  . Stress: Not on file  Relationships  . Social connections:    Talks on phone: Not on file    Gets together: Not on file    Attends religious service: Not on file    Active member of club or organization: Not on file    Attends meetings of clubs or organizations: Not on file    Relationship status: Not on file  . Intimate partner violence:    Fear of current or ex partner: Not on file    Emotionally abused: Not on file    Physically abused: Not on file    Forced sexual activity: Not on file  Other Topics Concern  . Not on file  Social History Narrative  . Not on file    Family History  Problem Relation Age of Onset  . Diabetes Mother   . Cancer Father       Review of Systems  Constitutional: Negative.   HENT: Negative.   Eyes: Negative.   Respiratory: Negative.   Cardiovascular: Negative.   Gastrointestinal: Negative.   Endocrine: Negative.   Genitourinary: Positive for difficulty urinating. Negative for dysuria, enuresis, flank pain, frequency and hematuria.  Musculoskeletal: Positive for arthralgias.       Old left ankle injury with occasional discomfort  Skin: Negative.   Neurological: Negative.   Psychiatric/Behavioral: Negative.     Patient Active Problem List   Diagnosis Date Noted  . Pain in joint, ankle and foot 02/18/2016    No Known Allergies  Prior to Admission medications   Not on File    Past Medical, Surgical Family and Social History reviewed and updated.    Objective:   Today's Vitals   03/02/18 1009  BP: 140/86  Pulse: 60  Temp: 98.2 F (36.8 C)  TempSrc: Oral  SpO2: 98%  Weight: 187 lb 12.8 oz (85.2 kg)  Height: 6' (1.829 m)    Wt Readings from Last 3 Encounters:  03/02/18 187 lb 12.8 oz (85.2 kg)  04/24/17 177  lb (80.3 kg)  02/23/17 189 lb (85.7 kg)    Physical Exam  Constitutional: He is oriented to person, place, and time. He appears well-developed and well-nourished.  HENT:  Head: Normocephalic and atraumatic.  Eyes: Pupils are equal, round, and reactive to light. Conjunctivae and EOM are normal.  Neck: Normal range of motion. Neck supple.  Cardiovascular: Normal rate, regular rhythm and normal heart sounds.  Pulmonary/Chest: Effort normal and breath sounds normal.  Abdominal: Soft. Bowel sounds are normal.  Musculoskeletal: Normal range of motion.  Neurological: He is alert and oriented to person, place, and time.  Skin: Skin is warm and dry.  Psychiatric: He has a normal mood and affect. His behavior is normal. Judgment and thought content normal.           Assessment & Plan:  1. Elevated glucose - POCT glycosylated hemoglobin (Hb A1C) - POCT urinalysis dip  (device)  2. Urinary retention - Comprehensive metabolic panel - PSA  3. Screening for thyroid disorder - Thyroid Panel With TSH  4. Screening, anemia, deficiency, iron  - CBC with Differential  5. Screening for cardiovascular condition  - EKG 12-Lead  6. Screen for colon cancer  - Ambulatory referral to Gastroenterology  Educated on cologuard due to no positive family history of Cancer, pt declines option for now   If symptoms worsen or do not improve, return for follow-up, follow-up with PCP, or at the emergency department if severity of symptoms warrant a higher level of care. Festus Aloe, FNP Student

## 2018-03-31 ENCOUNTER — Telehealth: Payer: Self-pay

## 2018-03-31 NOTE — Telephone Encounter (Signed)
No answer and no vm set up. 

## 2018-03-31 NOTE — Telephone Encounter (Signed)
Patient given address for gastro

## 2018-05-06 ENCOUNTER — Other Ambulatory Visit: Payer: Self-pay

## 2018-05-06 ENCOUNTER — Ambulatory Visit (AMBULATORY_SURGERY_CENTER): Payer: Self-pay

## 2018-05-06 VITALS — Ht 72.0 in | Wt 182.6 lb

## 2018-05-06 DIAGNOSIS — Z1211 Encounter for screening for malignant neoplasm of colon: Secondary | ICD-10-CM

## 2018-05-06 MED ORDER — PEG-KCL-NACL-NASULF-NA ASC-C 140 G PO SOLR: 1.0000 | Freq: Once | ORAL | 0 refills | Status: AC

## 2018-05-06 NOTE — Progress Notes (Signed)
Denies allergies to eggs or soy products. Denies complication of anesthesia or sedation. Denies use of weight loss medication. Denies use of O2.   Emmi instructions declined.  

## 2018-05-10 ENCOUNTER — Telehealth: Payer: Self-pay | Admitting: Gastroenterology

## 2018-05-10 DIAGNOSIS — Z1211 Encounter for screening for malignant neoplasm of colon: Secondary | ICD-10-CM

## 2018-05-10 MED ORDER — PEG-KCL-NACL-NASULF-NA ASC-C 140 G PO SOLR: 1.0000 | Freq: Once | ORAL | 0 refills | Status: AC

## 2018-05-10 NOTE — Telephone Encounter (Signed)
Plenvu sent to [facility] per pt's request. Encouraged patient to call us back if he has any problems with this.

## 2018-05-13 ENCOUNTER — Telehealth: Payer: Self-pay

## 2018-05-13 ENCOUNTER — Other Ambulatory Visit: Payer: Self-pay | Admitting: *Deleted

## 2018-05-13 DIAGNOSIS — Z1211 Encounter for screening for malignant neoplasm of colon: Secondary | ICD-10-CM

## 2018-05-13 MED ORDER — PEG-KCL-NACL-NASULF-NA ASC-C 140 G PO SOLR: 1.0000 | Freq: Once | ORAL | 0 refills | Status: AC

## 2018-05-13 NOTE — Progress Notes (Signed)
Patient requested RX transferred to [facility] on [address].

## 2018-05-13 NOTE — Telephone Encounter (Signed)
Patient calling back stating that insurance will not cover this.

## 2018-05-13 NOTE — Telephone Encounter (Signed)
A new prescription for Plenvu was sent in to [facility] on [address] per patient request. Informed patient that prescription was sent in.   Riki Sheer, LPN

## 2018-05-20 ENCOUNTER — Telehealth: Payer: Self-pay | Admitting: Gastroenterology

## 2018-05-20 ENCOUNTER — Encounter: Payer: Self-pay | Admitting: Gastroenterology

## 2018-05-20 NOTE — Telephone Encounter (Signed)
This patient did not show for colonoscopy.  There was a coverage issue for the prep, but I still do not see a call from patient to cancel.  Assess no-show charge.  One chance to reschedule.  Miralax/ducolax prep.

## 2018-05-20 NOTE — Telephone Encounter (Signed)
Please charge no show fee, then route back to me. Need to reschedule the colonoscopy.

## 2018-05-21 ENCOUNTER — Telehealth: Payer: Self-pay | Admitting: *Deleted

## 2018-05-21 NOTE — Telephone Encounter (Signed)
  Follow up Call-  No flowsheet data found.   Patient questions:  Man answered the phone, but was unable to recall any procedure.  Did not answer when I asked him if he or anyone in his family were here yesterday.

## 2018-09-21 ENCOUNTER — Telehealth: Payer: Self-pay

## 2018-09-21 NOTE — Telephone Encounter (Signed)
Called, no answer and no voicemail set up. Will try later. Thanks!

## 2018-09-22 ENCOUNTER — Encounter: Payer: Self-pay | Admitting: Family Medicine

## 2018-09-22 ENCOUNTER — Ambulatory Visit (INDEPENDENT_AMBULATORY_CARE_PROVIDER_SITE_OTHER): Payer: Medicare Other | Admitting: Family Medicine

## 2018-09-22 ENCOUNTER — Telehealth: Payer: Self-pay

## 2018-09-22 VITALS — BP 130/82 | HR 80 | Temp 97.8°F | Ht 72.0 in | Wt 193.2 lb

## 2018-09-22 DIAGNOSIS — R7303 Prediabetes: Secondary | ICD-10-CM

## 2018-09-22 DIAGNOSIS — Z1211 Encounter for screening for malignant neoplasm of colon: Secondary | ICD-10-CM | POA: Diagnosis not present

## 2018-09-22 DIAGNOSIS — D2230 Melanocytic nevi of unspecified part of face: Secondary | ICD-10-CM | POA: Diagnosis not present

## 2018-09-22 DIAGNOSIS — R8290 Unspecified abnormal findings in urine: Secondary | ICD-10-CM

## 2018-09-22 DIAGNOSIS — Z09 Encounter for follow-up examination after completed treatment for conditions other than malignant neoplasm: Secondary | ICD-10-CM | POA: Diagnosis not present

## 2018-09-22 DIAGNOSIS — Z23 Encounter for immunization: Secondary | ICD-10-CM

## 2018-09-22 DIAGNOSIS — Z131 Encounter for screening for diabetes mellitus: Secondary | ICD-10-CM

## 2018-09-22 DIAGNOSIS — R350 Frequency of micturition: Secondary | ICD-10-CM

## 2018-09-22 LAB — POCT URINALYSIS DIP (MANUAL ENTRY)
Bilirubin, UA: NEGATIVE
Glucose, UA: NEGATIVE mg/dL
Ketones, POC UA: NEGATIVE mg/dL
Leukocytes, UA: NEGATIVE
Nitrite, UA: NEGATIVE
Protein Ur, POC: NEGATIVE mg/dL
Spec Grav, UA: 1.015 (ref 1.010–1.025)
Urobilinogen, UA: 2 E.U./dL — AB
pH, UA: 7 (ref 5.0–8.0)

## 2018-09-22 NOTE — Progress Notes (Signed)
Follow Up  Subjective:    Patient ID: Ruben Rivas, male    DOB: 1961-11-26, 57 y.o.   MRN: [id number]   Chief Complaint  Patient presents with  . Nevus    left side of face/ A1C was 5.7     HPI  Ruben Rivas is a 57 year old male with a past medical history of TB, Sustance Abuse, Stroke, Hypertension, GERD, and Arthritis. He is here today for follow up and assessment of chronic issues.   Current Status: Since his last office visit, he is doing well with no complaints. He reports frequent urination. He has mild left ankle pain, r/t a previous injury. He does not take anything for pain. He is currently not taking any medications. He reports family history of Diabetes. He denies increased thirst, frequent urination, hunger, fatigue, blurred vision, excessive hunger, excessive thirst, weight gain, weight loss, and poor wound healing.   He denies fevers, chills, fatigue, recent infections, weight loss, and night sweats. He has not had any headaches, visual changes, dizziness, and falls. No chest pain, heart palpitations, cough and shortness of breath reported. No reports of GI problems such as nausea, vomiting, diarrhea, and constipation. He has no reports of blood in stools, dysuria and hematuria. No depression or anxiety reported. He denies pain today.   Past Medical History:  Diagnosis Date  . Arthritis   . GERD (gastroesophageal reflux disease)   . Hypertension   . Stroke ([place])    2001  . Substance abuse ([place])   . Tuberculosis    treated in 2004    Family History  Problem Relation Age of Onset  . Diabetes Mother   . Cancer Father   . Stomach cancer Father   . Esophageal cancer Brother   . Liver cancer Brother   . Rectal cancer Brother   . Colon cancer Neg Hx   . Pancreatic cancer Neg Hx     Social History   Socioeconomic History  . Marital status: Single    Spouse name: Not on file  . Number of children: Not on file  . Years of education: Not on file  . Highest  education level: Not on file  Occupational History  . Not on file  Social Needs  . Financial resource strain: Not on file  . Food insecurity:    Worry: Not on file    Inability: Not on file  . Transportation needs:    Medical: Not on file    Non-medical: Not on file  Tobacco Use  . Smoking status: Former Research scientist (life sciences)  . Smokeless tobacco: Never Used  . Tobacco comment: Quit two and a half years ago.  Substance and Sexual Activity  . Alcohol use: No    Alcohol/week: 0.0 standard drinks    Comment: Quit two and a half years ago.  . Drug use: No  . Sexual activity: Not on file  Lifestyle  . Physical activity:    Days per week: Not on file    Minutes per session: Not on file  . Stress: Not on file  Relationships  . Social connections:    Talks on phone: Not on file    Gets together: Not on file    Attends religious service: Not on file    Active member of club or organization: Not on file    Attends meetings of clubs or organizations: Not on file    Relationship status: Not on file  . Intimate partner violence:    Fear  of current or ex partner: Not on file    Emotionally abused: Not on file    Physically abused: Not on file    Forced sexual activity: Not on file  Other Topics Concern  . Not on file  Social History Narrative  . Not on file    Past Surgical History:  Procedure Laterality Date  . ANKLE FRACTURE SURGERY  2001    Immunization History  Administered Date(s) Administered  . Influenza,inj,Quad PF,6+ Mos 08/22/2016, 10/05/2017, 09/22/2018  . Tdap 05/27/2016    No outpatient medications have been marked as taking for the 09/22/18 encounter (Office Visit) with Azzie Glatter, FNP.    BP 130/82 (BP Location: Left Arm, Patient Position: Sitting, Cuff Size: Small)   Pulse 80   Temp 97.8 F (36.6 C) (Oral)   Ht 6' (1.829 m)   Wt 193 lb 3.2 oz (87.6 kg)   SpO2 100%   BMI 26.20 kg/m    Review of Systems  Constitutional: Negative.   HENT: Negative.    Respiratory: Negative.   Cardiovascular: Negative.   Genitourinary: Negative.   Musculoskeletal: Positive for arthralgias (mild left ankle pain).  Skin: Negative.   Neurological: Negative.   Psychiatric/Behavioral: Negative.    Objective:   Physical Exam  Constitutional: He is oriented to person, place, and time. He appears well-developed and well-nourished.  HENT:  Head: Normocephalic and atraumatic.  Right Ear: External ear normal.  Left Ear: External ear normal.  Nose: Nose normal.  Mouth/Throat: Oropharynx is clear and moist.  Neck: Normal range of motion. Neck supple.  Cardiovascular: Normal rate, regular rhythm, normal heart sounds and intact distal pulses.  Pulmonary/Chest: Effort normal and breath sounds normal.  Abdominal: Soft. Bowel sounds are normal.  Musculoskeletal: Normal range of motion.  Neurological: He is alert and oriented to person, place, and time.  Skin: Skin is warm and dry.  Psychiatric: He has a normal mood and affect. His behavior is normal. Judgment and thought content normal.  Nursing note and vitals reviewed.  Assessment & Plan:   1. Change in facial mole - Ambulatory referral to Dermatology  2. Pre-diabetes Hgb A1c is at 5.7 today. He will continue to decrease foods/beverages high in sugars and carbs and follow Heart Healthy or DASH diet. Increase physical activity to at least 30 minutes cardio exercise daily.   3. Urinary frequency Stable. He does not want treatment for condition at this time. Monitor.   4. Screen for colon cancer He will follow up for needed Colonoscopy at next office visit. Monitor.   5. Screening for diabetes mellitus  6. Abnormal urinalysis - Urine Culture  7. Need for immunization against influenza - Flu Vaccine QUAD 36+ mos IM  8. Follow up We will follow up in 3 months.  - POCT urinalysis dipstick  No orders of the defined types were placed in this encounter.   Kathe Becton,  MSN, FNP-C [name] [address] [place], [place] [address] [phone]

## 2018-09-24 LAB — URINE CULTURE: Organism ID, Bacteria: NO GROWTH

## 2018-12-22 ENCOUNTER — Ambulatory Visit (INDEPENDENT_AMBULATORY_CARE_PROVIDER_SITE_OTHER): Payer: Medicare Other | Admitting: Family Medicine

## 2018-12-22 ENCOUNTER — Encounter: Payer: Self-pay | Admitting: Family Medicine

## 2018-12-22 VITALS — BP 128/76 | HR 66 | Temp 98.4°F | Ht 72.0 in | Wt 181.1 lb

## 2018-12-22 DIAGNOSIS — R350 Frequency of micturition: Secondary | ICD-10-CM

## 2018-12-22 DIAGNOSIS — M25572 Pain in left ankle and joints of left foot: Secondary | ICD-10-CM

## 2018-12-22 DIAGNOSIS — Z0000 Encounter for general adult medical examination without abnormal findings: Secondary | ICD-10-CM | POA: Diagnosis not present

## 2018-12-22 DIAGNOSIS — D2230 Melanocytic nevi of unspecified part of face: Secondary | ICD-10-CM | POA: Diagnosis not present

## 2018-12-22 DIAGNOSIS — Z09 Encounter for follow-up examination after completed treatment for conditions other than malignant neoplasm: Secondary | ICD-10-CM

## 2018-12-22 LAB — POCT URINALYSIS DIP (MANUAL ENTRY)
Bilirubin, UA: NEGATIVE
Blood, UA: NEGATIVE
Glucose, UA: NEGATIVE mg/dL
Ketones, POC UA: NEGATIVE mg/dL
Leukocytes, UA: NEGATIVE
Nitrite, UA: NEGATIVE
Protein Ur, POC: NEGATIVE mg/dL
Spec Grav, UA: 1.015 (ref 1.010–1.025)
Urobilinogen, UA: 1 E.U./dL
pH, UA: 7 (ref 5.0–8.0)

## 2018-12-22 NOTE — Progress Notes (Signed)
Established Patient Office Visit  Subjective:  Patient ID: Ruben Rivas, male    DOB: 04-Dec-1960  Age: 58 y.o. MRN: [id number]  CC:  Chief Complaint  Patient presents with  . Follow-up    3 month on chronic condition/ order colonoscopy   . A1c    5.5    HPI Ruben Rivas is a 58 year old male who presents for follow up of chronic diseases.   Past Medical History:  Diagnosis Date  . Arthritis   . GERD (gastroesophageal reflux disease)   . Hypertension   . Stroke ([place])    2001  . Substance abuse ([place])   . Tuberculosis    treated in 2004   Current Status: Since his last office visit, he is doing well with no complaints. He denies visual changes, chest pain, cough, shortness of breath, heart palpitations, and falls. He has occasionally headaches and dizziness with position changes. Denies severe headaches, confusion, seizures, double vision, and blurred vision, nausea and vomiting. He continues to report frequent urination. Referred him to Dermatology at last office visit for facial mole removal, but it has since fell off. He was referred for Colonoscopy at last office visit, and he has had procedure as of yet. No reports of GI problems such as nausea, vomiting, diarrhea, and constipation. He has no reports of blood in stools, dysuria and hematuria.  He denies fevers, chills, fatigue, recent infections, weight loss, and night sweats. He has not had any headaches, visual changes, dizziness, and falls. No chest pain, heart palpitations, cough and shortness of breath reported. No depression or anxiety reported. He denies pain today.   Past Surgical History:  Procedure Laterality Date  . ANKLE FRACTURE SURGERY  2001    Family History  Problem Relation Age of Onset  . Diabetes Mother   . Cancer Father   . Stomach cancer Father   . Esophageal cancer Brother   . Liver cancer Brother   . Rectal cancer Brother   . Colon cancer Neg Hx   . Pancreatic cancer Neg Hx     Social  History   Socioeconomic History  . Marital status: Single    Spouse name: Not on file  . Number of children: Not on file  . Years of education: Not on file  . Highest education level: Not on file  Occupational History  . Not on file  Social Needs  . Financial resource strain: Not on file  . Food insecurity:    Worry: Not on file    Inability: Not on file  . Transportation needs:    Medical: Not on file    Non-medical: Not on file  Tobacco Use  . Smoking status: Former Research scientist (life sciences)  . Smokeless tobacco: Never Used  . Tobacco comment: Quit two and a half years ago.  Substance and Sexual Activity  . Alcohol use: No    Alcohol/week: 0.0 standard drinks    Comment: Quit two and a half years ago.  . Drug use: No  . Sexual activity: Not on file  Lifestyle  . Physical activity:    Days per week: Not on file    Minutes per session: Not on file  . Stress: Not on file  Relationships  . Social connections:    Talks on phone: Not on file    Gets together: Not on file    Attends religious service: Not on file    Active member of club or organization: Not on file    Attends  meetings of clubs or organizations: Not on file    Relationship status: Not on file  . Intimate partner violence:    Fear of current or ex partner: Not on file    Emotionally abused: Not on file    Physically abused: Not on file    Forced sexual activity: Not on file  Other Topics Concern  . Not on file  Social History Narrative  . Not on file    No outpatient medications prior to visit.   No facility-administered medications prior to visit.     No Known Allergies  ROS Review of Systems  Constitutional: Negative.   HENT: Negative.   Respiratory: Negative.   Cardiovascular: Negative.   Gastrointestinal: Negative.   Endocrine: Negative.   Genitourinary: Positive for frequency.  Musculoskeletal: Negative.   Skin: Negative.   Allergic/Immunologic: Negative.   Neurological: Positive for dizziness and  headaches.  Hematological: Negative.   Psychiatric/Behavioral: Negative.       Objective:    Physical Exam  Constitutional: He is oriented to person, place, and time. He appears well-developed and well-nourished.  HENT:  Head: Normocephalic and atraumatic.  Eyes: Conjunctivae are normal.  Neck: Normal range of motion. Neck supple.  Cardiovascular: Normal rate, regular rhythm, normal heart sounds and intact distal pulses.  Pulmonary/Chest: Effort normal and breath sounds normal.  Abdominal: Soft. Bowel sounds are normal.  Musculoskeletal: Normal range of motion.  Neurological: He is alert and oriented to person, place, and time. He has normal reflexes.  Skin: Skin is warm and dry.  Psychiatric: He has a normal mood and affect. His behavior is normal. Judgment and thought content normal.  Nursing note and vitals reviewed.   BP 128/76   Pulse 66   Temp 98.4 F (36.9 C) (Oral)   Ht 6' (1.829 m)   Wt 181 lb 1.6 oz (82.1 kg)   SpO2 100%   BMI 24.56 kg/m  Wt Readings from Last 3 Encounters:  12/22/18 181 lb 1.6 oz (82.1 kg)  09/22/18 193 lb 3.2 oz (87.6 kg)  05/06/18 182 lb 9.6 oz (82.8 kg)     Health Maintenance Due  Topic Date Due  . COLONOSCOPY  12/08/2011    There are no preventive care reminders to display for this patient.  Lab Results  Component Value Date   TSH 1.180 03/02/2018   Lab Results  Component Value Date   WBC 4.5 03/02/2018   HGB 13.8 03/02/2018   HCT 42.1 03/02/2018   MCV 84 03/02/2018   PLT 288 03/02/2018   Lab Results  Component Value Date   NA 139 03/02/2018   K 4.5 03/02/2018   CO2 23 03/02/2018   GLUCOSE 91 03/02/2018   BUN 12 03/02/2018   CREATININE 0.77 03/02/2018   BILITOT 0.3 03/02/2018   ALKPHOS 68 03/02/2018   AST 27 03/02/2018   ALT 28 03/02/2018   PROT 7.4 03/02/2018   ALBUMIN 4.4 03/02/2018   CALCIUM 9.5 03/02/2018   Lab Results  Component Value Date   CHOL 200 02/18/2016   Lab Results  Component Value Date    HDL 43 02/18/2016   Lab Results  Component Value Date   LDLCALC 135 (H) 02/18/2016   Lab Results  Component Value Date   TRIG 109 02/18/2016   Lab Results  Component Value Date   CHOLHDL 4.7 02/18/2016   Lab Results  Component Value Date   HGBA1C 5.2 03/02/2018     Assessment & Plan:   1. Routine adult  health maintenance  2. Left ankle pain, unspecified chronicity Mild today.   3. Urinary frequency Stable. He will inform office if worsens.   4. Change in facial mole Resolved.   5. Follow up He will follow up in 6 months.  We will draw labs at next office visit. - POCT urinalysis dipstick   No orders of the defined types were placed in this encounter.  Kathe Becton,  MSN, FNP-C [facility] [place], [place] [address] [phone]    Problem List Items Addressed This Visit    None    Visit Diagnoses    Routine adult health maintenance    -  Primary   Left ankle pain, unspecified chronicity       Urinary frequency       Change in facial mole       Follow up       Relevant Orders   POCT urinalysis dipstick (Completed)      No orders of the defined types were placed in this encounter.   Follow-up: Return in about 6 months (around 06/22/2019).    Azzie Glatter, FNP

## 2018-12-29 ENCOUNTER — Emergency Department (HOSPITAL_COMMUNITY)
Admission: EM | Admit: 2018-12-29 | Discharge: 2018-12-29 | Disposition: A | Discharge status: Home / Self Care | Payer: Medicare Other | Attending: Emergency Medicine | Admitting: Emergency Medicine

## 2018-12-29 ENCOUNTER — Encounter (HOSPITAL_COMMUNITY): Payer: Self-pay | Admitting: *Deleted

## 2018-12-29 DIAGNOSIS — L0201 Cutaneous abscess of face: Secondary | ICD-10-CM | POA: Insufficient documentation

## 2018-12-29 DIAGNOSIS — L0291 Cutaneous abscess, unspecified: Secondary | ICD-10-CM

## 2018-12-29 DIAGNOSIS — Z87891 Personal history of nicotine dependence: Secondary | ICD-10-CM | POA: Insufficient documentation

## 2018-12-29 DIAGNOSIS — H6002 Abscess of left external ear: Secondary | ICD-10-CM | POA: Diagnosis not present

## 2018-12-29 MED ORDER — SULFAMETHOXAZOLE-TRIMETHOPRIM 800-160 MG PO TABS: 1.0000 | ORAL_TABLET | Freq: Two times a day (BID) | ORAL | 0 refills | Status: AC

## 2018-12-29 NOTE — ED Provider Notes (Signed)
[facility] EMERGENCY DEPARTMENT Provider Note   CSN: [id number] Arrival date & time: 12/29/18  0946     History   Chief Complaint Chief Complaint  Patient presents with  . Insect Bite    HPI Ruben Rivas is a 58 y.o. male.  The history is provided by the patient. No language interpreter was used.  Abscess  Location:  Face Facial abscess location:  L cheek Size:  1 Abscess quality: redness and warmth   Red streaking: no   Progression:  Worsening Relieved by:  Nothing Worsened by:  Nothing Associated symptoms: no nausea   Pt reports he felt like something bit him.  Pt reports he has a swollen area on the left side of his face.   Past Medical History:  Diagnosis Date  . Arthritis   . GERD (gastroesophageal reflux disease)   . Hypertension   . Stroke ([place])    2001  . Substance abuse ([place])   . Tuberculosis    treated in 2004    Patient Active Problem List   Diagnosis Date Noted  . Urinary frequency 12/22/2018  . Pain in joint, ankle and foot 02/18/2016    Past Surgical History:  Procedure Laterality Date  . ANKLE FRACTURE SURGERY  2001        Home Medications    Prior to Admission medications   Medication Sig Start Date End Date Taking? Authorizing Provider  sulfamethoxazole-trimethoprim (BACTRIM DS,SEPTRA DS) 800-160 MG tablet Take 1 tablet by mouth 2 (two) times daily for 7 days. 12/29/18 01/05/19  Fransico Meadow, PA-C    Family History Family History  Problem Relation Age of Onset  . Diabetes Mother   . Cancer Father   . Stomach cancer Father   . Esophageal cancer Brother   . Liver cancer Brother   . Rectal cancer Brother   . Colon cancer Neg Hx   . Pancreatic cancer Neg Hx     Social History Social History   Tobacco Use  . Smoking status: Former Research scientist (life sciences)  . Smokeless tobacco: Never Used  . Tobacco comment: Quit two and a half years ago.  Substance Use Topics  . Alcohol use: No    Alcohol/week: 0.0 standard drinks      Comment: Quit two and a half years ago.  . Drug use: No     Allergies   Patient has no known allergies.   Review of Systems Review of Systems  Gastrointestinal: Negative for nausea.  All other systems reviewed and are negative.    Physical Exam Updated Vital Signs BP 136/88 (BP Location: Right Arm)   Pulse 78   Temp 97.8 F (36.6 C) (Oral)   Resp 20   SpO2 100%   Physical Exam Vitals signs and nursing note reviewed.  HENT:     Head: Normocephalic.     Comments: 1cm swollen area in front of left ear,      Mouth/Throat:     Mouth: Mucous membranes are moist.  Eyes:     Pupils: Pupils are equal, round, and reactive to light.  Neck:     Musculoskeletal: Normal range of motion.  Cardiovascular:     Rate and Rhythm: Normal rate.  Pulmonary:     Effort: Pulmonary effort is normal.  Skin:    General: Skin is warm.  Neurological:     General: No focal deficit present.     Mental Status: He is alert.  Psychiatric:  Mood and Affect: Mood normal.      ED Treatments / Results  Labs (all labs ordered are listed, but only abnormal results are displayed) Labs Reviewed - No data to display  EKG None  Radiology No results found.  Procedures Procedures (including critical care time)  Medications Ordered in ED Medications - No data to display   Initial Impression / Assessment and Plan / ED Course  I have reviewed the triage vital signs and the nursing notes.  Pertinent labs & imaging results that were available during my care of the patient were reviewed by me and considered in my medical decision making (see chart for details).     MDM  Pt counseled on options.  Pt opts not to have I and D.  Pt will try antibiotics and warm compresses.   Final Clinical Impressions(s) / ED Diagnoses   Final diagnoses:  Abscess    ED Discharge Orders         Ordered    sulfamethoxazole-trimethoprim (BACTRIM DS,SEPTRA DS) 800-160 MG tablet  2 times daily      12/29/18 1002        An After Visit Summary was printed and given to the patient.    Sidney Ace 12/29/18 1024    Carmin Muskrat, MD 12/30/18 1606

## 2018-12-29 NOTE — Discharge Instructions (Signed)
Soak area 20 minutes 4 times a day.  Return if area does not resolve with soaking and antibiotics

## 2018-12-29 NOTE — ED Triage Notes (Signed)
Pt in c/o insect bite to the left side of his face that occurred a few days ago, swelling increased this morning

## 2019-03-09 ENCOUNTER — Telehealth: Payer: Self-pay

## 2019-03-09 ENCOUNTER — Other Ambulatory Visit: Payer: Self-pay | Admitting: Family Medicine

## 2019-03-09 DIAGNOSIS — Z1211 Encounter for screening for malignant neoplasm of colon: Secondary | ICD-10-CM

## 2019-03-09 NOTE — Telephone Encounter (Signed)
Patient would like to have a referral for a colonoscopy. Patient didn't have it done back in June last year

## 2019-03-15 NOTE — Telephone Encounter (Signed)
Patient notified

## 2019-04-07 NOTE — Telephone Encounter (Signed)
Message sent to provider 

## 2019-06-14 ENCOUNTER — Ambulatory Visit: Payer: Medicare Other | Admitting: *Deleted

## 2019-06-14 ENCOUNTER — Other Ambulatory Visit: Payer: Self-pay

## 2019-06-14 VITALS — Ht 72.0 in | Wt 190.0 lb

## 2019-06-14 DIAGNOSIS — Z800 Family history of malignant neoplasm of digestive organs: Secondary | ICD-10-CM

## 2019-06-14 MED ORDER — SUPREP BOWEL PREP KIT 17.5-3.13-1.6 GM/177ML PO SOLN: 1.0000 | Freq: Once | ORAL | 0 refills | Status: AC

## 2019-06-14 NOTE — Progress Notes (Signed)
No egg or soy allergy known to patient  No issues with past sedation with any surgeries  or procedures, no intubation problems  No diet pills per patient No home 02 use per patient  No blood thinners per patient  Pt denies issues with constipation  No A fib or A flutter  EMMI video sent to pt's e mail  Pt to e mail 2 ins cards to [facility] today per pt.  Pt verified name, DOB, address and insurance during PV today. Pt mailed instruction packet to included paper to complete and mail back to [facility] with addressed and stamped envelope, Emmi video, copy of consent form to read and not return, and instructions.  PV completed over the phone. Pt encouraged to call with questions or issues   Pt is aware that care partner will wait in the car during procedure; if they feel like they will be too hot to wait in the car; they may wait in the lobby.  We want them to wear a mask (we do not have any that we can provide them), practice social distancing, and we will check their temperatures when they get here.  I did remind patient that their care partner needs to stay in the parking lot the entire time. Pt will wear mask into building.

## 2019-06-22 ENCOUNTER — Other Ambulatory Visit: Payer: Self-pay

## 2019-06-22 ENCOUNTER — Encounter: Payer: Self-pay | Admitting: Family Medicine

## 2019-06-22 ENCOUNTER — Ambulatory Visit (INDEPENDENT_AMBULATORY_CARE_PROVIDER_SITE_OTHER): Payer: Medicare Other | Admitting: Family Medicine

## 2019-06-22 VITALS — BP 132/70 | HR 60 | Temp 98.0°F | Ht 72.0 in | Wt 188.2 lb

## 2019-06-22 DIAGNOSIS — Z131 Encounter for screening for diabetes mellitus: Secondary | ICD-10-CM

## 2019-06-22 DIAGNOSIS — R350 Frequency of micturition: Secondary | ICD-10-CM | POA: Diagnosis not present

## 2019-06-22 DIAGNOSIS — I10 Essential (primary) hypertension: Secondary | ICD-10-CM | POA: Diagnosis not present

## 2019-06-22 DIAGNOSIS — Z09 Encounter for follow-up examination after completed treatment for conditions other than malignant neoplasm: Secondary | ICD-10-CM | POA: Diagnosis not present

## 2019-06-22 DIAGNOSIS — E538 Deficiency of other specified B group vitamins: Secondary | ICD-10-CM | POA: Diagnosis not present

## 2019-06-22 DIAGNOSIS — Z0000 Encounter for general adult medical examination without abnormal findings: Secondary | ICD-10-CM | POA: Diagnosis not present

## 2019-06-22 DIAGNOSIS — Z125 Encounter for screening for malignant neoplasm of prostate: Secondary | ICD-10-CM | POA: Diagnosis not present

## 2019-06-22 DIAGNOSIS — E559 Vitamin D deficiency, unspecified: Secondary | ICD-10-CM | POA: Diagnosis not present

## 2019-06-22 DIAGNOSIS — Z8673 Personal history of transient ischemic attack (TIA), and cerebral infarction without residual deficits: Secondary | ICD-10-CM

## 2019-06-22 DIAGNOSIS — Z1211 Encounter for screening for malignant neoplasm of colon: Secondary | ICD-10-CM | POA: Diagnosis not present

## 2019-06-22 LAB — POCT URINALYSIS DIPSTICK
Bilirubin, UA: NEGATIVE
Blood, UA: NEGATIVE
Glucose, UA: NEGATIVE
Ketones, UA: NEGATIVE
Leukocytes, UA: NEGATIVE
Nitrite, UA: NEGATIVE
Protein, UA: NEGATIVE
Spec Grav, UA: 1.025 (ref 1.010–1.025)
Urobilinogen, UA: 1 E.U./dL
pH, UA: 7 (ref 5.0–8.0)

## 2019-06-22 NOTE — Progress Notes (Signed)
Patient [facility]   Established Patient Office Visit  Subjective:  Patient ID: Ruben Rivas, male    DOB: 05-15-1961  Age: 58 y.o. MRN: [id number]  CC:  Chief Complaint  Patient presents with  . Follow-up    health maintenance   . A1c was 5.9    HPI Rachid Parham is a 58 year old male who presents for follow up today.   Past Medical History:  Diagnosis Date  . Arthritis   . GERD (gastroesophageal reflux disease)   . Hypertension   . Stroke ([place])    2001  . Substance abuse ([place])   . Tuberculosis    treated in 2004   Current Status: Since his last office visit, he is doing well with no complaints. He denies visual changes, chest pain, cough, shortness of breath, heart palpitations, and falls. He has occasional headaches and dizziness with position changes. Denies severe headaches, confusion, seizures, double vision, and blurred vision, nausea and vomiting. He has not had is Colonoscopy. He is scheduled for procedure on 06/28/2019. He continues to have occasional left ankle pain, which he uses OTC pain medications for aide in relief of pain. He continue to have urinary frequency.   He denies fevers, chills, fatigue, recent infections, weight loss, and night sweats. He has not had any falls. No chest pain, heart palpitations, cough and shortness of breath reported. No reports of GI problems such as diarrhea, and constipation. He has no reports of blood in stools, dysuria and hematuria. No depression or anxiety reported. He denies pain today.   Past Surgical History:  Procedure Laterality Date  . ANKLE FRACTURE SURGERY  2001    Family History  Problem Relation Age of Onset  . Diabetes Mother   . Cancer Father   . Stomach cancer Father   . Esophageal cancer Brother   . Liver cancer Brother   . Rectal cancer Brother   . Colon cancer Neg Hx   . Pancreatic cancer Neg Hx   . Colon polyps Neg Hx     Social History    Socioeconomic History  . Marital status: Single    Spouse name: Not on file  . Number of children: Not on file  . Years of education: Not on file  . Highest education level: Not on file  Occupational History  . Not on file  Social Needs  . Financial resource strain: Not on file  . Food insecurity    Worry: Not on file    Inability: Not on file  . Transportation needs    Medical: Not on file    Non-medical: Not on file  Tobacco Use  . Smoking status: Former Research scientist (life sciences)  . Smokeless tobacco: Never Used  . Tobacco comment: Quit two and a half years ago.  Substance and Sexual Activity  . Alcohol use: No    Alcohol/week: 0.0 standard drinks    Comment: Quit two and a half years ago.  . Drug use: No  . Sexual activity: Not on file  Lifestyle  . Physical activity    Days per week: Not on file    Minutes per session: Not on file  . Stress: Not on file  Relationships  . Social Herbalist on phone: Not on file    Gets together: Not on file    Attends religious service: Not on file    Active member of club or organization: Not on file  Attends meetings of clubs or organizations: Not on file    Relationship status: Not on file  . Intimate partner violence    Fear of current or ex partner: Not on file    Emotionally abused: Not on file    Physically abused: Not on file    Forced sexual activity: Not on file  Other Topics Concern  . Not on file  Social History Narrative  . Not on file    No outpatient medications prior to visit.   No facility-administered medications prior to visit.     No Known Allergies  ROS Review of Systems    Objective:    Physical Exam  BP 132/70 (BP Location: Left Arm, Patient Position: Sitting, Cuff Size: Small)   Pulse 60   Temp 98 F (36.7 C) (Oral)   Ht 6' (1.829 m)   Wt 188 lb 3.2 oz (85.4 kg)   SpO2 100%   BMI 25.52 kg/m  Wt Readings from Last 3 Encounters:  06/22/19 188 lb 3.2 oz (85.4 kg)  06/14/19 190 lb (86.2 kg)   12/22/18 181 lb 1.6 oz (82.1 kg)     Health Maintenance Due  Topic Date Due  . COLONOSCOPY  12/08/2011    There are no preventive care reminders to display for this patient.  Lab Results  Component Value Date   TSH 1.180 03/02/2018   Lab Results  Component Value Date   WBC 4.5 03/02/2018   HGB 13.8 03/02/2018   HCT 42.1 03/02/2018   MCV 84 03/02/2018   PLT 288 03/02/2018   Lab Results  Component Value Date   NA 139 03/02/2018   K 4.5 03/02/2018   CO2 23 03/02/2018   GLUCOSE 91 03/02/2018   BUN 12 03/02/2018   CREATININE 0.77 03/02/2018   BILITOT 0.3 03/02/2018   ALKPHOS 68 03/02/2018   AST 27 03/02/2018   ALT 28 03/02/2018   PROT 7.4 03/02/2018   ALBUMIN 4.4 03/02/2018   CALCIUM 9.5 03/02/2018   Lab Results  Component Value Date   CHOL 200 02/18/2016   Lab Results  Component Value Date   HDL 43 02/18/2016   Lab Results  Component Value Date   LDLCALC 135 (H) 02/18/2016   Lab Results  Component Value Date   TRIG 109 02/18/2016   Lab Results  Component Value Date   CHOLHDL 4.7 02/18/2016   Lab Results  Component Value Date   HGBA1C 5.2 03/02/2018      Assessment & Plan:   1. Hypertension, unspecified type The current medical regimen is effective; blood pressure  is stable at 132/70 today; continue present plan and medications as prescribed. He will continue to decrease high sodium intake, excessive alcohol intake, increase potassium intake, smoking cessation, and increase physical activity of at least 30 minutes of cardio activity daily. He will continue to follow Heart Healthy or DASH diet. - CBC with Differential - Comprehensive metabolic panel - Lipid Panel - TSH  2. History of stroke Stable. No signs or symptoms of recurrence noted or reported.   3. Urinary frequency - Urinalysis Dipstick - PSA  4. Vitamin B12 deficiency - Vitamin B12  5. Vitamin D deficiency - Vitamin D, 25-hydroxy  6. Encounter for screening for malignant  neoplasm of prostate PSA results are pending.   7. Healthcare maintenance  8. Screening for diabetes mellitus  9. Screen for colon cancer We will continue to discuss need for Colonoscopy at next office visit.   10. Follow up He  will follow up in 6 months.   No orders of the defined types were placed in this encounter.   Orders Placed This Encounter  Procedures  . CBC with Differential  . Comprehensive metabolic panel  . Lipid Panel  . TSH  . Vitamin D, 25-hydroxy  . Vitamin B12  . PSA  . Urinalysis Dipstick    Referral Orders  No referral(s) requested today    Kathe Becton,  MSN, FNP-BC [address], [place] [address] [phone]- fax  Problem List Items Addressed This Visit      Other   Urinary frequency   Relevant Orders   Urinalysis Dipstick (Completed)   PSA    Other Visit Diagnoses    Hypertension, unspecified type    -  Primary   Relevant Orders   CBC with Differential   Comprehensive metabolic panel   Lipid Panel   TSH   History of stroke       Vitamin B12 deficiency       Relevant Orders   Vitamin B12   Vitamin D deficiency       Relevant Orders   Vitamin D, 25-hydroxy   Encounter for screening for malignant neoplasm of prostate       Healthcare maintenance       Screening for diabetes mellitus       Screen for colon cancer       Follow up          No orders of the defined types were placed in this encounter.   Follow-up: Return in about 6 months (around 12/23/2019).    Azzie Glatter, FNP

## 2019-06-27 ENCOUNTER — Telehealth: Payer: Self-pay | Admitting: Gastroenterology

## 2019-06-27 NOTE — Telephone Encounter (Signed)
Covid-19 Screening Questions     Do you now or have you had a fever in the last 14 days?        Do you have any respiratory symptoms of shortness of breath or cough now or in the last 14 days?       Do you have any family members or close contacts with diagnosed or suspected Covid-19 in the past 14 days?        Have you been tested for Covid-19 and found to be positive?       Will need to make patient aware that care partner may wait in the car or come up to the lobby during the procedure but will need to provide their own mask.

## 2019-06-28 ENCOUNTER — Encounter: Payer: Self-pay | Admitting: Gastroenterology

## 2019-06-28 ENCOUNTER — Ambulatory Visit (AMBULATORY_SURGERY_CENTER): Payer: Medicare Other | Admitting: Gastroenterology

## 2019-06-28 ENCOUNTER — Other Ambulatory Visit: Payer: Self-pay

## 2019-06-28 VITALS — BP 112/73 | HR 52 | Temp 98.5°F | Resp 14 | Ht 72.0 in | Wt 190.0 lb

## 2019-06-28 DIAGNOSIS — Z800 Family history of malignant neoplasm of digestive organs: Secondary | ICD-10-CM

## 2019-06-28 DIAGNOSIS — D124 Benign neoplasm of descending colon: Secondary | ICD-10-CM

## 2019-06-28 DIAGNOSIS — Z1211 Encounter for screening for malignant neoplasm of colon: Secondary | ICD-10-CM | POA: Diagnosis not present

## 2019-06-28 DIAGNOSIS — D125 Benign neoplasm of sigmoid colon: Secondary | ICD-10-CM | POA: Diagnosis not present

## 2019-06-28 DIAGNOSIS — D123 Benign neoplasm of transverse colon: Secondary | ICD-10-CM | POA: Diagnosis not present

## 2019-06-28 DIAGNOSIS — K635 Polyp of colon: Secondary | ICD-10-CM | POA: Diagnosis not present

## 2019-06-28 MED ORDER — SODIUM CHLORIDE 0.9 % IV SOLN: 500.0000 mL | Freq: Once | INTRAVENOUS | Status: DC

## 2019-06-28 NOTE — Op Note (Signed)
[facility] Patient Name: Ruben Rivas Procedure Date: 06/28/2019 9:52 AM MRN: [id number] Endoscopist: Mallie Mussel L. Loletha Carrow , MD Age: 58 Referring MD:  Date of Birth: 21-Jun-1961 Gender: Male Account #: [id number] Procedure:                Colonoscopy Indications:              Colon cancer screening in patient at increased                            risk: rectal cancer in brother Medicines:                Monitored Anesthesia Care Procedure:                Pre-Anesthesia Assessment:                           - Prior to the procedure, a History and Physical                            was performed, and patient medications and                            allergies were reviewed. The patient's tolerance of                            previous anesthesia was also reviewed. The risks                            and benefits of the procedure and the sedation                            options and risks were discussed with the patient.                            All questions were answered, and informed consent                            was obtained. Prior Anticoagulants: The patient has                            taken no previous anticoagulant or antiplatelet                            agents. ASA Grade Assessment: III - A patient with                            severe systemic disease. After reviewing the risks                            and benefits, the patient was deemed in                            satisfactory condition to undergo the procedure.  After obtaining informed consent, the colonoscope                            was passed under direct vision. Throughout the                            procedure, the patient's blood pressure, pulse, and                            oxygen saturations were monitored continuously. The                            Colonoscope was introduced through the anus and                            advanced to the the cecum,  identified by                            appendiceal orifice and ileocecal valve. The                            colonoscopy was performed without difficulty. The                            patient tolerated the procedure well. The quality                            of the bowel preparation was excellent. The                            ileocecal valve, appendiceal orifice, and rectum                            were photographed. Scope In: 10:02:32 AM Scope Out: 10:20:32 AM Scope Withdrawal Time: 0 hours 14 minutes 41 seconds  Total Procedure Duration: 0 hours 18 minutes 0 seconds  Findings:                 The perianal and digital rectal examinations were                            normal.                           Two sessile polyps were found in the descending                            colon and transverse colon. The polyps were 4 to 8                            mm in size. These polyps were removed with a cold                            snare. Resection and retrieval were complete.  A 4 mm polyp was found in the proximal sigmoid                            colon. The polyp was semi-pedunculated. The polyp                            was removed with a hot snare. Resection and                            retrieval were complete.                           Diverticula were found in the left colon.                           The exam was otherwise without abnormality on                            direct and retroflexion views. Complications:            No immediate complications. Estimated Blood Loss:     Estimated blood loss was minimal. Impression:               - Two 4 to 8 mm polyps in the descending colon and                            in the transverse colon, removed with a cold snare.                            Resected and retrieved.                           - One 4 mm polyp in the proximal sigmoid colon,                            removed with a hot snare.  Resected and retrieved.                           - Diverticulosis in the left colon.                           - The examination was otherwise normal on direct                            and retroflexion views. Recommendation:           - Patient has a contact number available for                            emergencies. The signs and symptoms of potential                            delayed complications were discussed with the  patient. Return to normal activities tomorrow.                            Written discharge instructions were provided to the                            patient.                           - Resume previous diet.                           - Continue present medications.                           - Await pathology results.                           - Repeat colonoscopy is recommended for                            surveillance. The colonoscopy date will be                            determined after pathology results from today's                            exam become available for review. Henry L. Loletha Carrow, MD 06/28/2019 10:28:03 AM This report has been signed electronically.

## 2019-06-28 NOTE — Patient Instructions (Signed)
  Handouts given : Polyps and Diverticulosis.  YOU HAD AN ENDOSCOPIC PROCEDURE TODAY AT [facility]:   Refer to the procedure report that was given to you for any specific questions about what was found during the examination.  If the procedure report does not answer your questions, please call your gastroenterologist to clarify.  If you requested that your care partner not be given the details of your procedure findings, then the procedure report has been included in a sealed envelope for you to review at your convenience later.  YOU SHOULD EXPECT: Some feelings of bloating in the abdomen. Passage of more gas than usual.  Walking can help get rid of the air that was put into your GI tract during the procedure and reduce the bloating. If you had a lower endoscopy (such as a colonoscopy or flexible sigmoidoscopy) you may notice spotting of blood in your stool or on the toilet paper. If you underwent a bowel prep for your procedure, you may not have a normal bowel movement for a few days.  Please Note:  You might notice some irritation and congestion in your nose or some drainage.  This is from the oxygen used during your procedure.  There is no need for concern and it should clear up in a day or so.  SYMPTOMS TO REPORT IMMEDIATELY:   Following lower endoscopy (colonoscopy or flexible sigmoidoscopy):  Excessive amounts of blood in the stool  Significant tenderness or worsening of abdominal pains  Swelling of the abdomen that is new, acute  Fever of 100F or higher   For urgent or emergent issues, a gastroenterologist can be reached at any hour by calling [phone].   DIET:  We do recommend a small meal at first, but then you may proceed to your regular diet.  Drink plenty of fluids but you should avoid alcoholic beverages for 24 hours.  ACTIVITY:  You should plan to take it easy for the rest of today and you should NOT DRIVE or use heavy machinery until tomorrow (because of  the sedation medicines used during the test).    FOLLOW UP: Our staff will call the number listed on your records 48-72 hours following your procedure to check on you and address any questions or concerns that you may have regarding the information given to you following your procedure. If we do not reach you, we will leave a message.  We will attempt to reach you two times.  During this call, we will ask if you have developed any symptoms of COVID 19. If you develop any symptoms (ie: fever, flu-like symptoms, shortness of breath, cough etc.) before then, please call [phone].  If you test positive for Covid 19 in the 2 weeks post procedure, please call and report this information to [place].    If any biopsies were taken you will be contacted by phone or by letter within the next 1-3 weeks.  Please call us at [phone] if you have not heard about the biopsies in 3 weeks.    SIGNATURES/CONFIDENTIALITY: You and/or your care partner have signed paperwork which will be entered into your electronic medical record.  These signatures attest to the fact that that the information above on your After Visit Summary has been reviewed and is understood.  Full responsibility of the confidentiality of this discharge information lies with you and/or your care-partner.

## 2019-06-28 NOTE — Progress Notes (Signed)
Pt Drowsy. VSS. To PACU, report to RN. No anesthetic complications noted.  

## 2019-06-30 ENCOUNTER — Telehealth: Payer: Self-pay

## 2019-06-30 NOTE — Telephone Encounter (Signed)
No answer, unable to leave a message, B.Michaela Broski RN. 

## 2019-06-30 NOTE — Telephone Encounter (Signed)
Second attempt follow up call made, no answer,  pt's vm is not set up.

## 2019-07-02 DIAGNOSIS — E785 Hyperlipidemia, unspecified: Secondary | ICD-10-CM

## 2019-07-02 DIAGNOSIS — E559 Vitamin D deficiency, unspecified: Secondary | ICD-10-CM

## 2019-07-02 HISTORY — DX: Hyperlipidemia, unspecified: E78.5

## 2019-07-02 HISTORY — DX: Vitamin D deficiency, unspecified: E55.9

## 2019-07-04 ENCOUNTER — Encounter: Payer: Self-pay | Admitting: Gastroenterology

## 2019-07-05 ENCOUNTER — Telehealth: Payer: Self-pay | Admitting: Gastroenterology

## 2019-07-05 NOTE — Telephone Encounter (Signed)
Called the patient, unable to leave a VM, the patient VM has not been set up yet.

## 2019-07-06 ENCOUNTER — Encounter: Payer: Self-pay | Admitting: Family Medicine

## 2019-07-06 ENCOUNTER — Other Ambulatory Visit: Payer: Self-pay | Admitting: Family Medicine

## 2019-07-06 DIAGNOSIS — E782 Mixed hyperlipidemia: Secondary | ICD-10-CM

## 2019-07-06 DIAGNOSIS — E786 Lipoprotein deficiency: Secondary | ICD-10-CM

## 2019-07-06 DIAGNOSIS — E559 Vitamin D deficiency, unspecified: Secondary | ICD-10-CM

## 2019-07-06 MED ORDER — VITAMIN D (ERGOCALCIFEROL) 1.25 MG (50000 UNIT) PO CAPS: 50000.0000 [IU] | ORAL_CAPSULE | ORAL | 3 refills | Status: DC

## 2019-07-06 MED ORDER — SIMVASTATIN 10 MG PO TABS: 10.0000 mg | ORAL_TABLET | Freq: Every day | ORAL | 3 refills | Status: DC

## 2019-07-06 NOTE — Telephone Encounter (Signed)
Spoke to the patient, read the letter that was sent out. Patient had no additional concerns or questions at the end of the call.

## 2019-07-07 NOTE — Telephone Encounter (Signed)
-----   Message from Azzie Glatter, [place] sent at 07/06/2019  3:52 PM EDT ----- Regarding: "Lab Results/New Rxs" Cholesterol levels are elevated. Rx sent to pharmacy. Continue low-fat, low cholesterol diet, increase fluids, increase fruits and vegetables. He will continue to decrease high sodium intake, excessive alcohol intake, increase potassium intake, smoking cessation, and increase physical activity of at least 30 minutes of cardio activity daily. He will continue to follow Heart Healthy or DASH diet.   Vitamin D level is extremely low. Rx for Vitamin D sent to pharmacy today. He should eat foods high in Vitamin D. These include: Salmon, Cod Liver Oil, Mushrooms, Canned Fish, Milk, and Egg Yolks.  All other labs are stable.     Please schedule patient to follow up (earlier), in 3 months. Thank you.

## 2019-07-07 NOTE — Telephone Encounter (Signed)
Tried to contact patient no answer and no vm set up 

## 2019-07-11 ENCOUNTER — Telehealth: Payer: Self-pay

## 2019-07-11 NOTE — Telephone Encounter (Signed)
Tried to contact patient no answer 

## 2019-07-11 NOTE — Telephone Encounter (Signed)
-----   Message from Azzie Glatter, [place] sent at 07/06/2019  3:52 PM EDT ----- Regarding: "Lab Results/New Rxs" Cholesterol levels are elevated. Rx sent to pharmacy. Continue low-fat, low cholesterol diet, increase fluids, increase fruits and vegetables. He will continue to decrease high sodium intake, excessive alcohol intake, increase potassium intake, smoking cessation, and increase physical activity of at least 30 minutes of cardio activity daily. He will continue to follow Heart Healthy or DASH diet.   Vitamin D level is extremely low. Rx for Vitamin D sent to pharmacy today. He should eat foods high in Vitamin D. These include: Salmon, Cod Liver Oil, Mushrooms, Canned Fish, Milk, and Egg Yolks.  All other labs are stable.     Please schedule patient to follow up (earlier), in 3 months. Thank you.

## 2019-07-14 NOTE — Telephone Encounter (Signed)
Letter will be mailed out to patient as I have been unable to reach by phone.

## 2019-07-14 NOTE — Telephone Encounter (Signed)
-----   Message from Azzie Glatter, [place] sent at 07/06/2019  3:52 PM EDT ----- Regarding: "Lab Results/New Rxs" Cholesterol levels are elevated. Rx sent to pharmacy. Continue low-fat, low cholesterol diet, increase fluids, increase fruits and vegetables. He will continue to decrease high sodium intake, excessive alcohol intake, increase potassium intake, smoking cessation, and increase physical activity of at least 30 minutes of cardio activity daily. He will continue to follow Heart Healthy or DASH diet.   Vitamin D level is extremely low. Rx for Vitamin D sent to pharmacy today. He should eat foods high in Vitamin D. These include: Salmon, Cod Liver Oil, Mushrooms, Canned Fish, Milk, and Egg Yolks.  All other labs are stable.     Please schedule patient to follow up (earlier), in 3 months. Thank you.

## 2019-07-20 ENCOUNTER — Telehealth: Payer: Self-pay | Admitting: Family Medicine

## 2019-07-22 ENCOUNTER — Encounter (HOSPITAL_COMMUNITY): Payer: Self-pay

## 2019-07-25 NOTE — Telephone Encounter (Signed)
Sent to np 

## 2019-08-30 DIAGNOSIS — Z23 Encounter for immunization: Secondary | ICD-10-CM | POA: Diagnosis not present

## 2019-12-11 ENCOUNTER — Other Ambulatory Visit: Payer: Self-pay | Admitting: Family Medicine

## 2019-12-11 DIAGNOSIS — E559 Vitamin D deficiency, unspecified: Secondary | ICD-10-CM

## 2019-12-21 ENCOUNTER — Other Ambulatory Visit: Payer: Self-pay

## 2019-12-21 ENCOUNTER — Encounter: Payer: Self-pay | Admitting: Family Medicine

## 2019-12-21 ENCOUNTER — Ambulatory Visit (INDEPENDENT_AMBULATORY_CARE_PROVIDER_SITE_OTHER): Payer: Medicare Other | Admitting: Family Medicine

## 2019-12-21 ENCOUNTER — Telehealth: Payer: Self-pay

## 2019-12-21 ENCOUNTER — Other Ambulatory Visit: Payer: Self-pay | Admitting: Family Medicine

## 2019-12-21 VITALS — BP 117/82 | HR 60 | Temp 97.9°F | Ht 72.0 in | Wt 187.6 lb

## 2019-12-21 DIAGNOSIS — Z8673 Personal history of transient ischemic attack (TIA), and cerebral infarction without residual deficits: Secondary | ICD-10-CM | POA: Insufficient documentation

## 2019-12-21 DIAGNOSIS — E782 Mixed hyperlipidemia: Secondary | ICD-10-CM | POA: Insufficient documentation

## 2019-12-21 DIAGNOSIS — R350 Frequency of micturition: Secondary | ICD-10-CM

## 2019-12-21 DIAGNOSIS — I10 Essential (primary) hypertension: Secondary | ICD-10-CM | POA: Insufficient documentation

## 2019-12-21 DIAGNOSIS — Z09 Encounter for follow-up examination after completed treatment for conditions other than malignant neoplasm: Secondary | ICD-10-CM | POA: Diagnosis not present

## 2019-12-21 DIAGNOSIS — E559 Vitamin D deficiency, unspecified: Secondary | ICD-10-CM

## 2019-12-21 DIAGNOSIS — G8929 Other chronic pain: Secondary | ICD-10-CM | POA: Insufficient documentation

## 2019-12-21 DIAGNOSIS — M25572 Pain in left ankle and joints of left foot: Secondary | ICD-10-CM

## 2019-12-21 MED ORDER — VITAMIN D (ERGOCALCIFEROL) 1.25 MG (50000 UNIT) PO CAPS: ORAL_CAPSULE | ORAL | 6 refills | Status: DC

## 2019-12-21 NOTE — Telephone Encounter (Signed)
Patient informed. 

## 2019-12-21 NOTE — Telephone Encounter (Signed)
-----   Message from Azzie Glatter, [place] sent at 12/21/2019 10:39 AM EST ----- Regarding: "Vitamin D Refill" Please inform patient to continue Vitamin D supplement weekly as prescribed. Rx refill sent to pharmacy today.

## 2019-12-21 NOTE — Progress Notes (Signed)
Patient [name]    Established Patient Office Visit  Subjective:  Patient ID: Ruben Rivas, male    DOB: 1961-11-28  Age: 59 y.o. MRN: [id number]  CC:  Chief Complaint  Patient presents with  . Follow-up    6 mth follow up HTN    HPI Ruben Rivas is a 59 year old male who presents for Follow Up today.   Past Medical History:  Diagnosis Date  . Arthritis   . GERD (gastroesophageal reflux disease)   . Hyperlipidemia 07/2019  . Hypertension   . Stroke ([place])    2001  . Substance abuse ([place])   . Tuberculosis    treated in 2004  . Vitamin D deficiency 07/2019   Current Status: Since his last office visit, he continues to have mild chronic left ankle pain, which he take Acetaminophen for relief. He completed Colonoscopy 06/2019 with 6 polyps found and no other findings, to follow up in 5 years. He denies visual changes, chest pain, cough, shortness of breath, heart palpitations, and falls. He has occasional headaches and dizziness with position changes. Denies severe headaches, confusion, seizures, double vision, and blurred vision, nausea and vomiting. He denies fevers, chills, fatigue, recent infections, weight loss, and night sweats. No reports of GI problems such as diarrhea, and constipation. He has no reports of blood in stools, dysuria and hematuria. No depression or anxiety reported today. He denies suicidal ideations, homicidal ideations, or auditory hallucinations.   Past Surgical History:  Procedure Laterality Date  . ANKLE FRACTURE SURGERY  2001    Family History  Problem Relation Age of Onset  . Diabetes Mother   . Cancer Father   . Stomach cancer Father   . Esophageal cancer Brother   . Liver cancer Brother   . Rectal cancer Brother   . Colon cancer Neg Hx   . Pancreatic cancer Neg Hx   . Colon polyps Neg Hx     Social History   Socioeconomic History  . Marital status: Single    Spouse name: Not on file   . Number of children: Not on file  . Years of education: Not on file  . Highest education level: Not on file  Occupational History  . Not on file  Tobacco Use  . Smoking status: Former Smoker    Quit date: 06/27/2016    Years since quitting: 3.4  . Smokeless tobacco: Never Used  Substance and Sexual Activity  . Alcohol use: No    Alcohol/week: 0.0 standard drinks    Comment: Quit two and a half years ago.  . Drug use: No  . Sexual activity: Yes  Other Topics Concern  . Not on file  Social History Narrative  . Not on file   Social Determinants of Health   Financial Resource Strain:   . Difficulty of Paying Living Expenses: Not on file  Food Insecurity:   . Worried About Charity fundraiser in the Last Year: Not on file  . Ran Out of Food in the Last Year: Not on file  Transportation Needs:   . Lack of Transportation (Medical): Not on file  . Lack of Transportation (Non-Medical): Not on file  Physical Activity:   . Days of Exercise per Week: Not on file  . Minutes of Exercise per Session: Not on file  Stress:   . Feeling of Stress : Not on file  Social Connections:   . Frequency of Communication with Friends and  Family: Not on file  . Frequency of Social Gatherings with Friends and Family: Not on file  . Attends Religious Services: Not on file  . Active Member of Clubs or Organizations: Not on file  . Attends Archivist Meetings: Not on file  . Marital Status: Not on file  Intimate Partner Violence:   . Fear of Current or Ex-Partner: Not on file  . Emotionally Abused: Not on file  . Physically Abused: Not on file  . Sexually Abused: Not on file    Outpatient Medications Prior to Visit  Medication Sig Dispense Refill  . simvastatin (ZOCOR) 10 MG tablet Take 1 tablet (10 mg total) by mouth at bedtime. 90 tablet 3  . Vitamin D, Ergocalciferol, (DRISDOL) 1.25 MG (50000 UT) CAPS capsule TAKE 1 CAPSULE BY MOUTH EVERY 7 DAYS (Patient not taking: Reported on  12/21/2019) 5 capsule 3   No facility-administered medications prior to visit.    No Known Allergies  ROS Review of Systems  Constitutional: Negative.   HENT: Negative.   Eyes: Negative.   Respiratory: Negative.   Cardiovascular: Negative.   Gastrointestinal: Negative.   Endocrine: Negative.   Genitourinary: Negative.   Musculoskeletal: Positive for arthralgias (chronic mild left ankle pain).  Skin: Negative.   Allergic/Immunologic: Negative.   Neurological: Negative.   Hematological: Negative.   Psychiatric/Behavioral: Negative.       Objective:    Physical Exam  Constitutional: He is oriented to person, place, and time. He appears well-developed and well-nourished.  HENT:  Head: Normocephalic and atraumatic.  Eyes: Conjunctivae are normal.  Cardiovascular: Normal rate, regular rhythm, normal heart sounds and intact distal pulses.  Pulmonary/Chest: Effort normal and breath sounds normal.  Abdominal: Soft. Bowel sounds are normal.  Musculoskeletal:     Cervical back: Normal range of motion and neck supple.     Comments: Limited ROM in left ankle.   Neurological: He is alert and oriented to person, place, and time. He has normal reflexes.  Skin: Skin is warm.  Psychiatric: He has a normal mood and affect. His behavior is normal. Judgment and thought content normal.  Nursing note and vitals reviewed.   BP 117/82   Pulse 60   Temp 97.9 F (36.6 C) (Oral)   Ht 6' (1.829 m)   Wt 187 lb 9.6 oz (85.1 kg)   SpO2 99%   BMI 25.44 kg/m  Wt Readings from Last 3 Encounters:  12/21/19 187 lb 9.6 oz (85.1 kg)  06/28/19 190 lb (86.2 kg)  06/22/19 188 lb 3.2 oz (85.4 kg)     Health Maintenance Due  Topic Date Due  . INFLUENZA VACCINE  07/02/2019    There are no preventive care reminders to display for this patient.  Lab Results  Component Value Date   TSH 1.180 03/02/2018   Lab Results  Component Value Date   WBC 4.5 03/02/2018   HGB 13.8 03/02/2018   HCT  42.1 03/02/2018   MCV 84 03/02/2018   PLT 288 03/02/2018   Lab Results  Component Value Date   NA 139 03/02/2018   K 4.5 03/02/2018   CO2 23 03/02/2018   GLUCOSE 91 03/02/2018   BUN 12 03/02/2018   CREATININE 0.77 03/02/2018   BILITOT 0.3 03/02/2018   ALKPHOS 68 03/02/2018   AST 27 03/02/2018   ALT 28 03/02/2018   PROT 7.4 03/02/2018   ALBUMIN 4.4 03/02/2018   CALCIUM 9.5 03/02/2018   Lab Results  Component Value Date   CHOL  200 02/18/2016   Lab Results  Component Value Date   HDL 43 02/18/2016   Lab Results  Component Value Date   LDLCALC 135 (H) 02/18/2016   Lab Results  Component Value Date   TRIG 109 02/18/2016   Lab Results  Component Value Date   CHOLHDL 4.7 02/18/2016   Lab Results  Component Value Date   HGBA1C 5.2 03/02/2018      Assessment & Plan:   1. History of stroke No signs or symptoms noted or reported today. He will continue healthy diet and increase fluid intake. Monitor.   2. Hypertension, unspecified type Blood pressure is within normal range. He will continue to take medications as prescribed, to decrease high sodium intake, excessive alcohol intake, increase potassium intake, smoking cessation, and increase physical activity of at least 30 minutes of cardio activity daily. he will continue to follow Heart Healthy or DASH diet.  3. Mixed hyperlipidemia  4. Chronic pain of left ankle Stable. Continue Acetaminophen as needed.   5. Urinary frequency Stable. PSA 06/2019 within normal range. We will continue to monitor.   6. Vitamin D deficiency He will continue Vitamin D as prescribed.   7. Follow up He will follow up in 6 months.   No orders of the defined types were placed in this encounter.   No orders of the defined types were placed in this encounter.   Referral Orders  No referral(s) requested today    Kathe Becton,  MSN, FNP-BC [place], [place] [address] [phone] fax  Problem List Items Addressed This Visit      Other   Urinary frequency    Other Visit Diagnoses    History of stroke    -  Primary   Hypertension, unspecified type       Mixed hyperlipidemia       Chronic pain of left ankle       Vitamin D deficiency       Follow up          No orders of the defined types were placed in this encounter.   Follow-up: Return in about 6 months (around 06/19/2020).    Azzie Glatter, FNP

## 2020-03-27 DIAGNOSIS — Z79899 Other long term (current) drug therapy: Secondary | ICD-10-CM | POA: Diagnosis not present

## 2020-03-27 DIAGNOSIS — M545 Low back pain: Secondary | ICD-10-CM | POA: Diagnosis not present

## 2020-03-27 DIAGNOSIS — M129 Arthropathy, unspecified: Secondary | ICD-10-CM | POA: Diagnosis not present

## 2020-03-27 DIAGNOSIS — M25572 Pain in left ankle and joints of left foot: Secondary | ICD-10-CM | POA: Diagnosis not present

## 2020-03-27 DIAGNOSIS — E559 Vitamin D deficiency, unspecified: Secondary | ICD-10-CM | POA: Diagnosis not present

## 2020-03-27 DIAGNOSIS — Z1159 Encounter for screening for other viral diseases: Secondary | ICD-10-CM | POA: Diagnosis not present

## 2020-04-10 DIAGNOSIS — Z79899 Other long term (current) drug therapy: Secondary | ICD-10-CM | POA: Diagnosis not present

## 2020-04-10 DIAGNOSIS — M62838 Other muscle spasm: Secondary | ICD-10-CM | POA: Diagnosis not present

## 2020-04-10 DIAGNOSIS — M47816 Spondylosis without myelopathy or radiculopathy, lumbar region: Secondary | ICD-10-CM | POA: Diagnosis not present

## 2020-04-10 DIAGNOSIS — M25572 Pain in left ankle and joints of left foot: Secondary | ICD-10-CM | POA: Diagnosis not present

## 2020-05-08 DIAGNOSIS — M62838 Other muscle spasm: Secondary | ICD-10-CM | POA: Diagnosis not present

## 2020-05-08 DIAGNOSIS — Z79899 Other long term (current) drug therapy: Secondary | ICD-10-CM | POA: Diagnosis not present

## 2020-05-08 DIAGNOSIS — M47816 Spondylosis without myelopathy or radiculopathy, lumbar region: Secondary | ICD-10-CM | POA: Diagnosis not present

## 2020-05-08 DIAGNOSIS — M25572 Pain in left ankle and joints of left foot: Secondary | ICD-10-CM | POA: Diagnosis not present

## 2020-05-31 DIAGNOSIS — N529 Male erectile dysfunction, unspecified: Secondary | ICD-10-CM

## 2020-05-31 HISTORY — DX: Male erectile dysfunction, unspecified: N52.9

## 2020-06-19 ENCOUNTER — Other Ambulatory Visit: Payer: Self-pay

## 2020-06-19 ENCOUNTER — Ambulatory Visit (INDEPENDENT_AMBULATORY_CARE_PROVIDER_SITE_OTHER): Payer: Medicare Other | Admitting: Family Medicine

## 2020-06-19 ENCOUNTER — Encounter: Payer: Self-pay | Admitting: Family Medicine

## 2020-06-19 VITALS — BP 143/92 | HR 61 | Temp 97.3°F | Resp 16 | Ht 72.0 in | Wt 191.6 lb

## 2020-06-19 DIAGNOSIS — E559 Vitamin D deficiency, unspecified: Secondary | ICD-10-CM | POA: Diagnosis not present

## 2020-06-19 DIAGNOSIS — I10 Essential (primary) hypertension: Secondary | ICD-10-CM | POA: Diagnosis not present

## 2020-06-19 DIAGNOSIS — Z8673 Personal history of transient ischemic attack (TIA), and cerebral infarction without residual deficits: Secondary | ICD-10-CM

## 2020-06-19 DIAGNOSIS — Z09 Encounter for follow-up examination after completed treatment for conditions other than malignant neoplasm: Secondary | ICD-10-CM

## 2020-06-19 DIAGNOSIS — R350 Frequency of micturition: Secondary | ICD-10-CM | POA: Diagnosis not present

## 2020-06-19 DIAGNOSIS — E538 Deficiency of other specified B group vitamins: Secondary | ICD-10-CM

## 2020-06-19 DIAGNOSIS — N529 Male erectile dysfunction, unspecified: Secondary | ICD-10-CM | POA: Diagnosis not present

## 2020-06-19 DIAGNOSIS — E162 Hypoglycemia, unspecified: Secondary | ICD-10-CM

## 2020-06-19 DIAGNOSIS — Z0000 Encounter for general adult medical examination without abnormal findings: Secondary | ICD-10-CM

## 2020-06-19 LAB — GLUCOSE, POCT (MANUAL RESULT ENTRY): POC Glucose: 99 mg/dl (ref 70–99)

## 2020-06-19 LAB — POCT GLYCOSYLATED HEMOGLOBIN (HGB A1C)
HbA1c POC (<> result, manual entry): 5.7 % (ref 4.0–5.6)
HbA1c, POC (controlled diabetic range): 5.7 % (ref 0.0–7.0)
HbA1c, POC (prediabetic range): 5.7 % (ref 5.7–6.4)
Hemoglobin A1C: 5.7 % — AB (ref 4.0–5.6)

## 2020-06-19 LAB — POCT URINALYSIS DIPSTICK
Bilirubin, UA: NEGATIVE
Blood, UA: NEGATIVE
Glucose, UA: NEGATIVE
Ketones, UA: NEGATIVE
Leukocytes, UA: NEGATIVE
Nitrite, UA: NEGATIVE
Protein, UA: POSITIVE — AB
Spec Grav, UA: 1.025 (ref 1.010–1.025)
Urobilinogen, UA: 2 E.U./dL — AB
pH, UA: 7 (ref 5.0–8.0)

## 2020-06-19 MED ORDER — SILDENAFIL CITRATE 100 MG PO TABS: 50.0000 mg | ORAL_TABLET | Freq: Every day | ORAL | 11 refills | Status: DC | PRN

## 2020-06-19 NOTE — Patient Instructions (Signed)
Sildenafil tablets (Erectile Dysfunction) What is this medicine? SILDENAFIL (sil DEN a fil) is used to treat erection problems in men. This medicine may be used for other purposes; ask your health care provider or pharmacist if you have questions. COMMON BRAND NAME(S): Viagra What should I tell my health care provider before I take this medicine? They need to know if you have any of these conditions:  bleeding disorders  eye or vision problems, including a rare inherited eye disease called retinitis pigmentosa  anatomical deformation of the penis, Peyronie's disease, or history of priapism (painful and prolonged erection)  heart disease, angina, a history of heart attack, irregular heart beats, or other heart problems  high or low blood pressure  history of blood diseases, like sickle cell anemia or leukemia  history of stomach bleeding  kidney disease  liver disease  stroke  an unusual or allergic reaction to sildenafil, other medicines, foods, dyes, or preservatives  pregnant or trying to get pregnant  breast-feeding How should I use this medicine? Take this medicine by mouth with a glass of water. Follow the directions on the prescription label. The dose is usually taken 1 hour before sexual activity. You should not take the dose more than once per day. Do not take your medicine more often than directed. Talk to your pediatrician regarding the use of this medicine in children. This medicine is not used in children for this condition. Overdosage: If you think you have taken too much of this medicine contact a poison control center or emergency room at once. NOTE: This medicine is only for you. Do not share this medicine with others. What if I miss a dose? This does not apply. Do not take double or extra doses. What may interact with this medicine? Do not take this medicine with any of the following medications:  cisapride  nitrates like amyl nitrite, isosorbide  dinitrate, isosorbide mononitrate, nitroglycerin  riociguat This medicine may also interact with the following medications:  antiviral medicines for HIV or AIDS  bosentan  certain medicines for benign prostatic hyperplasia (BPH)  certain medicines for blood pressure  certain medicines for fungal infections like ketoconazole and itraconazole  cimetidine  erythromycin  rifampin This list may not describe all possible interactions. Give your health care provider a list of all the medicines, herbs, non-prescription drugs, or dietary supplements you use. Also tell them if you smoke, drink alcohol, or use illegal drugs. Some items may interact with your medicine. What should I watch for while using this medicine? If you notice any changes in your vision while taking this drug, call your doctor or health care professional as soon as possible. Stop using this medicine and call your health care provider right away if you have a loss of sight in one or both eyes. Contact your doctor or health care professional right away if you have an erection that lasts longer than 4 hours or if it becomes painful. This may be a sign of a serious problem and must be treated right away to prevent permanent damage. If you experience symptoms of nausea, dizziness, chest pain or arm pain upon initiation of sexual activity after taking this medicine, you should refrain from further activity and call your doctor or health care professional as soon as possible. Do not drink alcohol to excess (examples, 5 glasses of wine or 5 shots of whiskey) when taking this medicine. When taken in excess, alcohol can increase your chances of getting a headache or getting dizzy, increasing  your heart rate or lowering your blood pressure. Using this medicine does not protect you or your partner against HIV infection (the virus that causes AIDS) or other sexually transmitted diseases. What side effects may I notice from receiving this  medicine? Side effects that you should report to your doctor or health care professional as soon as possible:  allergic reactions like skin rash, itching or hives, swelling of the face, lips, or tongue  breathing problems  changes in hearing  changes in vision  chest pain  fast, irregular heartbeat  prolonged or painful erection  seizures Side effects that usually do not require medical attention (report to your doctor or health care professional if they continue or are bothersome):  back pain  dizziness  flushing  headache  indigestion  muscle aches  nausea  stuffy or runny nose This list may not describe all possible side effects. Call your doctor for medical advice about side effects. You may report side effects to FDA at 1-800-FDA-1088. Where should I keep my medicine? Keep out of reach of children. Store at room temperature between 15 and 30 degrees C (59 and 86 degrees F). Throw away any unused medicine after the expiration date. NOTE: This sheet is a summary. It may not cover all possible information. If you have questions about this medicine, talk to your doctor, pharmacist, or health care provider.  2020 Elsevier/Gold Standard (2015-10-31 12:00:25)  

## 2020-06-19 NOTE — Progress Notes (Signed)
Patient [facility]    Established Patient Office Visit  Subjective:  Patient ID: Ruben Rivas, male    DOB: 04/18/1961  Age: 59 y.o. MRN: [id number]  CC:  Chief Complaint  Patient presents with  . Follow-up    Pt states he is here for his 6 months f/u. Pt states has a question to ask when the provider comes in the room    HPI Ruben Rivas is a 59 year old male who presents for Follow Up today.    Patient Active Problem List   Diagnosis Date Noted  . Vitamin B12 deficiency 06/22/2020  . History of stroke 12/21/2019  . Hypertension 12/21/2019  . Mixed hyperlipidemia 12/21/2019  . Chronic pain of left ankle 12/21/2019  . Vitamin D deficiency 12/21/2019  . Urinary frequency 12/22/2018  . Pain in joint, ankle and foot 02/18/2016    Past Medical History:  Diagnosis Date  . Arthritis   . Chronic pain of left ankle   . GERD (gastroesophageal reflux disease)   . Hyperlipidemia 07/2019  . Hypertension   . Stroke ([place])    2001  . Substance abuse ([place])   . Tuberculosis    treated in 2004  . Vitamin D deficiency 07/2019    Past Surgical History:  Procedure Laterality Date  . ANKLE FRACTURE SURGERY  2001   Current Status: Since her last office visit, she is doing well with no complaints. He recently begin treatment with [facility] 3 ago for chronic ankle pain. She denies visual changes, chest pain, cough, shortness of breath, heart palpitations, and falls. She has occasional headaches and dizziness with position changes. Denies severe headaches, confusion, seizures, double vision, and blurred vision, nausea and vomiting. She denies fevers, chills, recent infections, weight loss, and night sweats. Denies GI problems such as diarrhea, and constipation. She has no reports of blood in stools, dysuria and hematuria. No depression or anxiety reported today. She is taking all medications as prescribed. She denies pain today.     Family History  Problem Relation Age of Onset  . Diabetes Mother   . Cancer Father   . Stomach cancer Father   . Esophageal cancer Brother   . Liver cancer Brother   . Rectal cancer Brother   . Colon cancer Neg Hx   . Pancreatic cancer Neg Hx   . Colon polyps Neg Hx     Social History   Socioeconomic History  . Marital status: Single    Spouse name: Not on file  . Number of children: Not on file  . Years of education: Not on file  . Highest education level: Not on file  Occupational History  . Not on file  Tobacco Use  . Smoking status: Former Smoker    Quit date: 06/27/2016    Years since quitting: 3.9  . Smokeless tobacco: Never Used  Vaping Use  . Vaping Use: Never used  Substance and Sexual Activity  . Alcohol use: No    Alcohol/week: 0.0 standard drinks    Comment: Quit two and a half years ago.  . Drug use: No  . Sexual activity: Yes  Other Topics Concern  . Not on file  Social History Narrative  . Not on file   Social Determinants of Health   Financial Resource Strain:   . Difficulty of Paying Living Expenses:   Food Insecurity:   . Worried About Charity fundraiser in the Last Year:   .  Ran Out of Food in the Last Year:   Transportation Needs:   . Film/video editor (Medical):   Marland Kitchen Lack of Transportation (Non-Medical):   Physical Activity:   . Days of Exercise per Week:   . Minutes of Exercise per Session:   Stress:   . Feeling of Stress :   Social Connections:   . Frequency of Communication with Friends and Family:   . Frequency of Social Gatherings with Friends and Family:   . Attends Religious Services:   . Active Member of Clubs or Organizations:   . Attends Archivist Meetings:   Marland Kitchen Marital Status:   Intimate Partner Violence:   . Fear of Current or Ex-Partner:   . Emotionally Abused:   Marland Kitchen Physically Abused:   . Sexually Abused:     Outpatient Medications Prior to Visit  Medication Sig Dispense Refill  . simvastatin  (ZOCOR) 10 MG tablet Take 1 tablet (10 mg total) by mouth at bedtime. 90 tablet 3  . Vitamin D, Ergocalciferol, (DRISDOL) 1.25 MG (50000 UNIT) CAPS capsule TAKE 1 CAPSULE BY MOUTH EVERY 7 DAYS 5 capsule 6   No facility-administered medications prior to visit.    No Known Allergies  ROS Review of Systems  Constitutional: Negative.   HENT: Negative.   Eyes: Negative.   Respiratory: Negative.   Cardiovascular: Negative.   Gastrointestinal: Negative.   Endocrine: Negative.   Genitourinary: Negative.   Musculoskeletal: Negative.   Skin: Negative.   Allergic/Immunologic: Negative.   Neurological: Positive for dizziness (occasional ) and headaches (occasional).  Hematological: Negative.   Psychiatric/Behavioral: Negative.       Objective:    Physical Exam Vitals and nursing note reviewed.  Constitutional:      Appearance: Normal appearance.  HENT:     Head: Normocephalic and atraumatic.     Nose: Nose normal.     Mouth/Throat:     Mouth: Mucous membranes are moist.  Cardiovascular:     Rate and Rhythm: Normal rate and regular rhythm.     Pulses: Normal pulses.     Heart sounds: Normal heart sounds.  Pulmonary:     Effort: Pulmonary effort is normal.     Breath sounds: Normal breath sounds.  Abdominal:     General: Bowel sounds are normal.     Palpations: Abdomen is soft.  Musculoskeletal:        General: Normal range of motion.     Cervical back: Normal range of motion and neck supple.  Skin:    General: Skin is warm and dry.  Neurological:     General: No focal deficit present.     Mental Status: He is alert and oriented to person, place, and time.  Psychiatric:        Mood and Affect: Mood normal.        Behavior: Behavior normal.        Thought Content: Thought content normal.        Judgment: Judgment normal.     BP (!) 143/92 (BP Location: Left Arm, Patient Position: Sitting, Cuff Size: Normal)   Pulse 61   Temp (!) 97.3 F (36.3 C)   Resp 16   Ht 6'  (1.829 m)   Wt 191 lb 9.6 oz (86.9 kg)   SpO2 100%   BMI 25.99 kg/m  Wt Readings from Last 3 Encounters:  06/19/20 191 lb 9.6 oz (86.9 kg)  12/21/19 187 lb 9.6 oz (85.1 kg)  06/28/19 190 lb (86.2 kg)  Health Maintenance Due  Topic Date Due  . COVID-19 Vaccine (1) Never done    There are no preventive care reminders to display for this patient.  Lab Results  Component Value Date   TSH 0.870 06/19/2020   Lab Results  Component Value Date   WBC 4.8 06/19/2020   HGB 14.5 06/19/2020   HCT 43.3 06/19/2020   MCV 84 06/19/2020   PLT 283 06/19/2020   Lab Results  Component Value Date   NA 138 06/19/2020   K 4.9 06/19/2020   CO2 24 06/19/2020   GLUCOSE 96 06/19/2020   BUN 12 06/19/2020   CREATININE 0.88 06/19/2020   BILITOT 0.3 06/19/2020   ALKPHOS 75 06/19/2020   AST 22 06/19/2020   ALT 25 06/19/2020   PROT 7.9 06/19/2020   ALBUMIN 4.5 06/19/2020   CALCIUM 9.9 06/19/2020   Lab Results  Component Value Date   CHOL 216 (H) 06/19/2020   Lab Results  Component Value Date   HDL 41 06/19/2020   Lab Results  Component Value Date   LDLCALC 157 (H) 06/19/2020   Lab Results  Component Value Date   TRIG 101 06/19/2020   Lab Results  Component Value Date   CHOLHDL 5.3 (H) 06/19/2020   Lab Results  Component Value Date   HGBA1C 5.7 (A) 06/19/2020   HGBA1C 5.7 06/19/2020   HGBA1C 5.7 06/19/2020   HGBA1C 5.7 06/19/2020      Assessment & Plan:   1. Hypertension, unspecified type He will continue to take medications as prescribed, to decrease high sodium intake, excessive alcohol intake, increase potassium intake, smoking cessation, and increase physical activity of at least 30 minutes of cardio activity daily. He will continue to follow Heart Healthy or DASH diet. - CBC; Future - CBC with Differential  2. History of stroke Stable. No signs or symptoms of   3. Vitamin D deficiency - Vitamin D, 25-hydroxy  4. Urinary frequency - PSA  5.  Hypoglycemia - Urinalysis Dipstick - POC Glucose (CBG) - POC HgB A1c  7. Vitamin B12 deficiency - Vitamin B12  8. Erectile dysfunction, unspecified erectile dysfunction type - sildenafil (VIAGRA) 100 MG tablet; Take 0.5-1 tablets (50-100 mg total) by mouth daily as needed for erectile dysfunction.  Dispense: 5 tablet; Refill: 11  9. Healthcare maintenance - Comprehensive metabolic panel - Lipid Panel - TSH - Vitamin D, 25-hydroxy  10. Follow up He will follow up in 6 months.   Problem List Items Addressed This Visit      Cardiovascular and Mediastinum   Hypertension - Primary   Relevant Medications   sildenafil (VIAGRA) 100 MG tablet   Other Relevant Orders   CBC   CBC with Differential (Completed)     Other   History of stroke   Urinary frequency   Relevant Orders   PSA (Completed)   Vitamin B12 deficiency   Relevant Orders   Vitamin B12 (Completed)   Vitamin D deficiency   Relevant Orders   Vitamin D, 25-hydroxy (Completed)    Other Visit Diagnoses    Hypoglycemia       Relevant Orders   Urinalysis Dipstick (Completed)   POC Glucose (CBG) (Completed)   POC HgB A1c (Completed)   Adverse effect of vitamin D and vitamin D derivative       Erectile dysfunction, unspecified erectile dysfunction type       Relevant Medications   sildenafil (VIAGRA) 100 MG tablet   Healthcare maintenance  Relevant Orders   Comprehensive metabolic panel (Completed)   Lipid Panel (Completed)   TSH (Completed)   Vitamin D, 25-hydroxy (Completed)   Follow up          Meds ordered this encounter  Medications  . sildenafil (VIAGRA) 100 MG tablet    Sig: Take 0.5-1 tablets (50-100 mg total) by mouth daily as needed for erectile dysfunction.    Dispense:  5 tablet    Refill:  11    Follow-up: Return in about 6 months (around 12/20/2020).    Azzie Glatter, FNP

## 2020-06-20 LAB — CBC WITH DIFFERENTIAL/PLATELET
Basophils Absolute: 0 10*3/uL (ref 0.0–0.2)
Basos: 1 %
EOS (ABSOLUTE): 0.2 10*3/uL (ref 0.0–0.4)
Eos: 3 %
Hematocrit: 43.3 % (ref 37.5–51.0)
Hemoglobin: 14.5 g/dL (ref 13.0–17.7)
Immature Grans (Abs): 0 10*3/uL (ref 0.0–0.1)
Immature Granulocytes: 0 %
Lymphocytes Absolute: 1.9 10*3/uL (ref 0.7–3.1)
Lymphs: 39 %
MCH: 28 pg (ref 26.6–33.0)
MCHC: 33.5 g/dL (ref 31.5–35.7)
MCV: 84 fL (ref 79–97)
Monocytes Absolute: 0.7 10*3/uL (ref 0.1–0.9)
Monocytes: 16 %
Neutrophils Absolute: 2 10*3/uL (ref 1.4–7.0)
Neutrophils: 41 %
Platelets: 283 10*3/uL (ref 150–450)
RBC: 5.18 x10E6/uL (ref 4.14–5.80)
RDW: 13.1 % (ref 11.6–15.4)
WBC: 4.8 10*3/uL (ref 3.4–10.8)

## 2020-06-20 LAB — COMPREHENSIVE METABOLIC PANEL
ALT: 25 IU/L (ref 0–44)
AST: 22 IU/L (ref 0–40)
Albumin/Globulin Ratio: 1.3 (ref 1.2–2.2)
Albumin: 4.5 g/dL (ref 3.8–4.9)
Alkaline Phosphatase: 75 IU/L (ref 48–121)
BUN/Creatinine Ratio: 14 (ref 9–20)
BUN: 12 mg/dL (ref 6–24)
Bilirubin Total: 0.3 mg/dL (ref 0.0–1.2)
CO2: 24 mmol/L (ref 20–29)
Calcium: 9.9 mg/dL (ref 8.7–10.2)
Chloride: 101 mmol/L (ref 96–106)
Creatinine, Ser: 0.88 mg/dL (ref 0.76–1.27)
GFR calc Af Amer: 109 mL/min/{1.73_m2} (ref >59)
GFR calc non Af Amer: 95 mL/min/{1.73_m2} (ref >59)
Globulin, Total: 3.4 g/dL (ref 1.5–4.5)
Glucose: 96 mg/dL (ref 65–99)
Potassium: 4.9 mmol/L (ref 3.5–5.2)
Sodium: 138 mmol/L (ref 134–144)
Total Protein: 7.9 g/dL (ref 6.0–8.5)

## 2020-06-20 LAB — PSA: Prostate Specific Ag, Serum: 1.9 ng/mL (ref 0.0–4.0)

## 2020-06-20 LAB — LIPID PANEL
Chol/HDL Ratio: 5.3 ratio — ABNORMAL HIGH (ref 0.0–5.0)
Cholesterol, Total: 216 mg/dL — ABNORMAL HIGH (ref 100–199)
HDL: 41 mg/dL (ref >39)
LDL Chol Calc (NIH): 157 mg/dL — ABNORMAL HIGH (ref 0–99)
Triglycerides: 101 mg/dL (ref 0–149)
VLDL Cholesterol Cal: 18 mg/dL (ref 5–40)

## 2020-06-20 LAB — VITAMIN B12: Vitamin B-12: 618 pg/mL (ref 232–1245)

## 2020-06-20 LAB — VITAMIN D 25 HYDROXY (VIT D DEFICIENCY, FRACTURES): Vit D, 25-Hydroxy: 34.3 ng/mL (ref 30.0–100.0)

## 2020-06-20 LAB — TSH: TSH: 0.87 u[IU]/mL (ref 0.450–4.500)

## 2020-06-22 DIAGNOSIS — E538 Deficiency of other specified B group vitamins: Secondary | ICD-10-CM | POA: Insufficient documentation

## 2020-06-25 NOTE — Progress Notes (Signed)
Pt was called @ 10:14am to discuss his labs. Pt stated he understood his labs and will keep his f/u appt.

## 2020-06-27 ENCOUNTER — Other Ambulatory Visit: Payer: Self-pay

## 2020-06-27 ENCOUNTER — Emergency Department (HOSPITAL_COMMUNITY): Payer: Medicare Other

## 2020-06-27 ENCOUNTER — Encounter (HOSPITAL_COMMUNITY): Payer: Self-pay | Admitting: *Deleted

## 2020-06-27 ENCOUNTER — Emergency Department (HOSPITAL_COMMUNITY)
Admission: EM | Admit: 2020-06-27 | Discharge: 2020-06-27 | Disposition: A | Discharge status: Home / Self Care | Payer: Medicare Other | Attending: Emergency Medicine | Admitting: Emergency Medicine

## 2020-06-27 DIAGNOSIS — R0789 Other chest pain: Secondary | ICD-10-CM | POA: Diagnosis not present

## 2020-06-27 DIAGNOSIS — Z87891 Personal history of nicotine dependence: Secondary | ICD-10-CM | POA: Insufficient documentation

## 2020-06-27 DIAGNOSIS — R05 Cough: Secondary | ICD-10-CM | POA: Diagnosis not present

## 2020-06-27 DIAGNOSIS — I10 Essential (primary) hypertension: Secondary | ICD-10-CM | POA: Diagnosis not present

## 2020-06-27 DIAGNOSIS — J069 Acute upper respiratory infection, unspecified: Secondary | ICD-10-CM | POA: Insufficient documentation

## 2020-06-27 DIAGNOSIS — Z20822 Contact with and (suspected) exposure to covid-19: Secondary | ICD-10-CM | POA: Diagnosis not present

## 2020-06-27 DIAGNOSIS — R059 Cough, unspecified: Secondary | ICD-10-CM

## 2020-06-27 DIAGNOSIS — B9789 Other viral agents as the cause of diseases classified elsewhere: Secondary | ICD-10-CM | POA: Diagnosis not present

## 2020-06-27 LAB — POC SARS CORONAVIRUS 2 AG -  ED: SARS Coronavirus 2 Ag: NEGATIVE

## 2020-06-27 MED ORDER — BENZONATATE 100 MG PO CAPS
100.0000 mg | ORAL_CAPSULE | Freq: Three times a day (TID) | ORAL | 0 refills | Status: AC
Start: 2020-06-27 — End: 2020-07-02

## 2020-06-27 NOTE — ED Provider Notes (Signed)
[facility] EMERGENCY DEPARTMENT Provider Note   CSN: [id number] Arrival date & time: 06/27/20  5638     History Chief Complaint  Patient presents with  . Nasal Congestion  . Cough    Ruben Rivas is a 59 y.o. male history includes hypertension, hyperlipidemia, CVA, polysubstance abuse, GERD.  Patient presents today for cough and rhinorrhea. Onset of symptoms was Sunday, 06/24/2020. He believes that someone at his work and was sneezing a lot caused him to have a URI. He reports clear rhinorrhea past 3 days. He describes cough as a mild intermittent cough with clear sputum occasionally. He reports a mild burning sensation in the center of his chest when he coughs this resolves immediately after he is done coughing, pain does not radiate. He has been taking Robitussin and NyQuil for his symptoms with some relief. He is requesting a Covid test and a work note.  He denies fever/chills, headache or neck stiffness, sore throat, shortness of breath, hemoptysis, chest pain when he is not coughing, abdominal pain, nausea/vomiting, diarrhea, extremity swelling/color change or any additional concerns.  HPI     Past Medical History:  Diagnosis Date  . Arthritis   . Chronic pain of left ankle   . GERD (gastroesophageal reflux disease)   . Hyperlipidemia 07/2019  . Hypertension   . Stroke ([place])    2001  . Substance abuse ([place])   . Tuberculosis    treated in 2004  . Vitamin D deficiency 07/2019    Patient Active Problem List   Diagnosis Date Noted  . Vitamin B12 deficiency 06/22/2020  . History of stroke 12/21/2019  . Hypertension 12/21/2019  . Mixed hyperlipidemia 12/21/2019  . Chronic pain of left ankle 12/21/2019  . Vitamin D deficiency 12/21/2019  . Urinary frequency 12/22/2018  . Pain in joint, ankle and foot 02/18/2016    Past Surgical History:  Procedure Laterality Date  . ANKLE FRACTURE SURGERY  2001       Family History  Problem Relation Age  of Onset  . Diabetes Mother   . Cancer Father   . Stomach cancer Father   . Esophageal cancer Brother   . Liver cancer Brother   . Rectal cancer Brother   . Colon cancer Neg Hx   . Pancreatic cancer Neg Hx   . Colon polyps Neg Hx     Social History   Tobacco Use  . Smoking status: Former Smoker    Quit date: 06/27/2016    Years since quitting: 4.0  . Smokeless tobacco: Never Used  Vaping Use  . Vaping Use: Never used  Substance Use Topics  . Alcohol use: No    Alcohol/week: 0.0 standard drinks    Comment: Quit two and a half years ago.  . Drug use: No    Home Medications Prior to Admission medications   Medication Sig Start Date End Date Taking? Authorizing Provider  benzonatate (TESSALON) 100 MG capsule Take 1 capsule (100 mg total) by mouth every 8 (eight) hours for 5 days. 06/27/20 07/02/20  Nuala Alpha A, PA-C  sildenafil (VIAGRA) 100 MG tablet Take 0.5-1 tablets (50-100 mg total) by mouth daily as needed for erectile dysfunction. 06/19/20   Azzie Glatter, FNP  simvastatin (ZOCOR) 10 MG tablet Take 1 tablet (10 mg total) by mouth at bedtime. 07/06/19   Azzie Glatter, FNP  Vitamin D, Ergocalciferol, (DRISDOL) 1.25 MG (50000 UNIT) CAPS capsule TAKE 1 CAPSULE BY MOUTH EVERY 7 DAYS 12/21/19   Clovis Riley,  Ellie Lunch, FNP    Allergies    Patient has no known allergies.  Review of Systems   Review of Systems Ten systems are reviewed and are negative for acute change except as noted in the HPI  Physical Exam Updated Vital Signs BP (!) 157/86   Pulse 63   Temp 99.2 F (37.3 C) (Oral)   Resp 15   Ht 6' (1.829 m)   Wt 87.1 kg   SpO2 100%   BMI 26.04 kg/m   Physical Exam Constitutional:      General: He is not in acute distress.    Appearance: Normal appearance. He is well-developed. He is not ill-appearing or diaphoretic.  HENT:     Head: Normocephalic and atraumatic.     Right Ear: Tympanic membrane and external ear normal.     Left Ear: Tympanic membrane and  external ear normal.     Mouth/Throat:     Mouth: Mucous membranes are moist.     Pharynx: Oropharynx is clear.  Eyes:     General: Vision grossly intact. Gaze aligned appropriately.     Conjunctiva/sclera: Conjunctivae normal.     Pupils: Pupils are equal, round, and reactive to light.  Neck:     Trachea: Trachea and phonation normal. No tracheal tenderness or tracheal deviation.     Meningeal: Brudzinski's sign absent.  Cardiovascular:     Rate and Rhythm: Normal rate and regular rhythm.     Pulses: Normal pulses.  Pulmonary:     Effort: Pulmonary effort is normal. No respiratory distress.     Breath sounds: Normal breath sounds.  Abdominal:     General: There is no distension.     Palpations: Abdomen is soft.     Tenderness: There is no abdominal tenderness. There is no guarding or rebound.  Musculoskeletal:        General: Normal range of motion.     Cervical back: Normal range of motion and neck supple.     Right lower leg: No edema.     Left lower leg: No edema.  Skin:    General: Skin is warm and dry.  Neurological:     Mental Status: He is alert.     GCS: GCS eye subscore is 4. GCS verbal subscore is 5. GCS motor subscore is 6.     Comments: Speech is clear and goal oriented, follows commands Major Cranial nerves without deficit, no facial droop Moves extremities without ataxia, coordination intact  Psychiatric:        Behavior: Behavior normal.     ED Results / Procedures / Treatments   Labs (all labs ordered are listed, but only abnormal results are displayed) Labs Reviewed  POC SARS CORONAVIRUS 2 AG -  ED    EKG EKG Interpretation  Date/Time:  Wednesday June 27 2020 08:55:39 EDT Ventricular Rate:  84 PR Interval:  140 QRS Duration: 84 QT Interval:  386 QTC Calculation: 456 R Axis:   34 Text Interpretation: Normal sinus rhythm Normal ECG No STEMI Confirmed by Octaviano Glow [phone]) on 06/27/2020 4:03:19 PM   Radiology DG Chest 2 View  Result  Date: 06/27/2020 CLINICAL DATA:  chest cold that started on Sunday and he has cough and runny nose. Pt states chest hurts only when he coughs. EXAM: CHEST - 2 VIEW COMPARISON:  None. FINDINGS: The heart size and mediastinal contours are within normal limits. Both lungs are clear. No pleural effusion or pneumothorax. Old healed left-sided rib fractures. No acute skeletal  abnormality. IMPRESSION: No active cardiopulmonary disease. Electronically Signed   By: Lajean Manes M.D.   On: 06/27/2020 09:51    Procedures Procedures (including critical care time)  Medications Ordered in ED Medications - No data to display  ED Course  I have reviewed the triage vital signs and the nursing notes.  Pertinent labs & imaging results that were available during my care of the patient were reviewed by me and considered in my medical decision making (see chart for details).    MDM Rules/Calculators/A&P                          Additional history obtained from: 1. Nursing notes from this visit. ----------------------------------------------- 59 year old male history as detailed above presents with URI symptoms x3 days. Rhinorrhea and mild cough with a burning sensation only present when he coughs. No shortness of breath, exertional chest pain or chest pain when he is not coughing. On exam he is well-appearing no acute distress. Cranial nerves intact, no meningeal signs, airway clear, heart regular rate and rhythm, lungs clear, abdomen soft nontender, extremities neurovascular intact without evidence of DVT. Patient appears to be experiencing a mild URI at this time. Work-up obtained in triage includes Covid test, chest x-ray and EKG. Reviewed below. = COVID Negative EKG: Normal sinus rhythm Normal ECG No STEMI Confirmed by Octaviano Glow [phone]) on 06/27/2020 4:03:19 PM  CXR:  IMPRESSION:  No active cardiopulmonary disease.  - Work-up is reassuring, patient symptoms today are consistent with viral URI. He  has no evidence of bacterial infection requiring antibiotics at this time. Additionally history and presentation is inconsistent with ACS, PE, dissection or other acute cardiopulmonary etiology of his symptoms today. Will prescribe Tessalon and encourage fluid hydration and rest.  At this time there does not appear to be any evidence of an acute emergency medical condition and the patient appears stable for discharge with appropriate outpatient follow up. Diagnosis was discussed with patient who verbalizes understanding of care plan and is agreeable to discharge. I have discussed return precautions with patient who verbalizes understanding. Patient encouraged to follow-up with their PCP. All questions answered.  Patient's case discussed with Dr. Langston Masker who agrees with plan to discharge with follow-up.   Ruben Rivas was evaluated in Emergency Department on 06/27/2020 for the symptoms described in the history of present illness. He was evaluated in the context of the global COVID-19 pandemic, which necessitated consideration that the patient might be at risk for infection with the SARS-CoV-2 virus that causes COVID-19. Institutional protocols and algorithms that pertain to the evaluation of patients at risk for COVID-19 are in a state of rapid change based on information released by regulatory bodies including the CDC and federal and state organizations. These policies and algorithms were followed during the patient's care in the ED.   Note: Portions of this report may have been transcribed using voice recognition software. Every effort was made to ensure accuracy; however, inadvertent computerized transcription errors may still be present. Final Clinical Impression(s) / ED Diagnoses Final diagnoses:  Viral URI with cough    Rx / DC Orders ED Discharge Orders         Ordered    benzonatate (TESSALON) 100 MG capsule  Every 8 hours     Discontinue  Reprint     06/27/20 [address] 06/27/20 1620    Wyvonnia Dusky, MD  06/27/20 1809  

## 2020-06-27 NOTE — Discharge Instructions (Signed)
At this time there does not appear to be the presence of an emergent medical condition, however there is always the potential for conditions to change. Please read and follow the below instructions.  Please return to the Emergency Department immediately for any new or worsening symptoms. Please be sure to follow up with your Primary Care Provider within one week regarding your visit today; please call their office to schedule an appointment even if you are feeling better for a follow-up visit. You may use the medication Tessalon to help with your cough. Please drink plenty of water and get plenty of rest.  Get help right away if: You feel pain or pressure in your chest. You have shortness of breath. You faint or feel like you will faint. You keep throwing up (vomiting). You feel confused. You have any new/concerning or worsening of symptoms  Please read the additional information packets attached to your discharge summary.  Do not take your medicine if  develop an itchy rash, swelling in your mouth or lips, or difficulty breathing; call 911 and seek immediate emergency medical attention if this occurs.  You may review your lab tests and imaging results in their entirety on your MyChart account.  Please discuss all results of fully with your primary care provider and other specialist at your follow-up visit.  Note: Portions of this text may have been transcribed using voice recognition software. Every effort was made to ensure accuracy; however, inadvertent computerized transcription errors may still be present.

## 2020-06-27 NOTE — ED Triage Notes (Signed)
Pt states he is here with a chest cold that started on Sunday and he has cough and runny nose.  Pt states chest hurts only when he coughs. Swabbed at triage

## 2020-07-04 ENCOUNTER — Other Ambulatory Visit: Payer: Self-pay

## 2020-07-04 ENCOUNTER — Ambulatory Visit (INDEPENDENT_AMBULATORY_CARE_PROVIDER_SITE_OTHER): Payer: Medicare Other | Admitting: Family Medicine

## 2020-07-04 ENCOUNTER — Encounter: Payer: Self-pay | Admitting: Family Medicine

## 2020-07-04 VITALS — BP 137/100 | HR 66 | Temp 97.9°F | Resp 18 | Ht 72.0 in | Wt 194.4 lb

## 2020-07-04 DIAGNOSIS — R05 Cough: Secondary | ICD-10-CM | POA: Diagnosis not present

## 2020-07-04 DIAGNOSIS — B349 Viral infection, unspecified: Secondary | ICD-10-CM

## 2020-07-04 DIAGNOSIS — Z09 Encounter for follow-up examination after completed treatment for conditions other than malignant neoplasm: Secondary | ICD-10-CM

## 2020-07-04 DIAGNOSIS — R058 Other specified cough: Secondary | ICD-10-CM

## 2020-07-04 LAB — POCT URINALYSIS DIPSTICK
Bilirubin, UA: NEGATIVE
Blood, UA: NEGATIVE
Glucose, UA: NEGATIVE
Ketones, UA: NEGATIVE
Leukocytes, UA: NEGATIVE
Nitrite, UA: NEGATIVE
Protein, UA: NEGATIVE
Spec Grav, UA: 1.025 (ref 1.010–1.025)
Urobilinogen, UA: >=8 E.U./dL — AB
pH, UA: 7 (ref 5.0–8.0)

## 2020-07-04 MED ORDER — HYDROCODONE-HOMATROPINE 5-1.5 MG/5ML PO SYRP: 5.0000 mL | ORAL_SOLUTION | Freq: Three times a day (TID) | ORAL | 0 refills | Status: DC | PRN

## 2020-07-04 MED ORDER — AMOXICILLIN-POT CLAVULANATE 875-125 MG PO TABS: 1.0000 | ORAL_TABLET | Freq: Two times a day (BID) | ORAL | 0 refills | Status: AC

## 2020-07-04 NOTE — Patient Instructions (Signed)
Amoxicillin; Clavulanic Acid Tablets What is this medicine? AMOXICILLIN; CLAVULANIC ACID (a mox i SIL in; KLAV yoo lan ic AS id) is a penicillin antibiotic. It treats some infections caused by bacteria. It will not work for colds, the flu, or other viruses. This medicine may be used for other purposes; ask your health care provider or pharmacist if you have questions. COMMON BRAND NAME(S): Augmentin What should I tell my health care provider before I take this medicine? They need to know if you have any of these conditions:  bowel disease, like colitis  kidney disease  liver disease  mononucleosis  an unusual or allergic reaction to amoxicillin, penicillin, cephalosporin, other antibiotics, clavulanic acid, other medicines, foods, dyes, or preservatives  pregnant or trying to get pregnant  breast-feeding How should I use this medicine? Take this drug by mouth. Take it as directed on the prescription label at the same time every day. Take it with food at the start of a meal or snack. Take all of this drug unless your health care provider tells you to stop it early. Keep taking it even if you think you are better. Talk to your health care provider about the use of this drug in children. While it may be prescribed for selected conditions, precautions do apply. Overdosage: If you think you have taken too much of this medicine contact a poison control center or emergency room at once. NOTE: This medicine is only for you. Do not share this medicine with others. What if I miss a dose? If you miss a dose, take it as soon as you can. If it is almost time for your next dose, take only that dose. Do not take double or extra doses. What may interact with this medicine?  allopurinol  anticoagulants  birth control pills  methotrexate  probenecid This list may not describe all possible interactions. Give your health care provider a list of all the medicines, herbs, non-prescription drugs, or  dietary supplements you use. Also tell them if you smoke, drink alcohol, or use illegal drugs. Some items may interact with your medicine. What should I watch for while using this medicine? Tell your doctor or healthcare provider if your symptoms do not improve. This medicine may cause serious skin reactions. They can happen weeks to months after starting the medicine. Contact your healthcare provider right away if you notice fevers or flu-like symptoms with a rash. The rash may be red or purple and then turn into blisters or peeling of the skin. Or, you might notice a red rash with swelling of the face, lips or lymph nodes in your neck or under your arms. Do not treat diarrhea with over the counter products. Contact your doctor if you have diarrhea that lasts more than 2 days or if it is severe and watery. If you have diabetes, you may get a false-positive result for sugar in your urine. Check with your doctor or healthcare provider. Birth control pills may not work properly while you are taking this medicine. Talk to your doctor about using an extra method of birth control. What side effects may I notice from receiving this medicine? Side effects that you should report to your doctor or health care professional as soon as possible:  allergic reactions like skin rash, itching or hives, swelling of the face, lips, or tongue  breathing problems  dark urine  fever or chills, sore throat  redness, blistering, peeling, or loosening of the skin, including inside the mouth  seizures  trouble passing urine or change in the amount of urine  unusual bleeding, bruising  unusually weak or tired  white patches or sores in the mouth or throat Side effects that usually do not require medical attention (report to your doctor or health care professional if they continue or are bothersome):  diarrhea  dizziness  headache  nausea, vomiting  stomach upset  vaginal or anal irritation This list  may not describe all possible side effects. Call your doctor for medical advice about side effects. You may report side effects to FDA at 1-800-FDA-1088. Where should I keep my medicine? Keep out of the reach of children and pets. Store at room temperature between 20 and 25 degrees C (68 and 77 degrees F). Throw away any unused drug after the expiration date. NOTE: This sheet is a summary. It may not cover all possible information. If you have questions about this medicine, talk to your doctor, pharmacist, or health care provider.  2020 Elsevier/Gold Standard (2019-06-20 11:55:53) Homatropine; Hydrocodone oral syrup What is this medicine? HYDROCODONE (hye droe KOE done) is used to help relieve cough. This medicine may be used for other purposes; ask your health care provider or pharmacist if you have questions. COMMON BRAND NAME(S): Hycodan, Hydromet, Hydropane, Mycodone What should I tell my health care provider before I take this medicine? They need to know if you have any of these conditions:  Addison's disease  brain tumor  gallbladder disease  glaucoma  head injury  heart disease  history of a drug or alcohol abuse problem  history of irregular heartbeat  if you often drink alcohol  kidney disease  liver disease  low blood pressure  lung or breathing disease, like asthma  mental illness  pancreatic disease  seizures  stomach or intestine problems  thyroid disease  trouble passing urine  an unusual or allergic reaction to hydrocodone, other medicines, foods, dyes, or preservatives  pregnant or trying to get pregnant  breast-feeding How should I use this medicine? Take this medicine by mouth. Follow the directions on the prescription label. You can take it with or without food. If it upsets your stomach, take it with food. Use a specially marked spoon or container to measure each dose. Ask your pharmacist if you do not have one. Household spoons are not  accurate. Do not to overfill. Rinse the measuring device with water after each use. Take your medicine at regular intervals. Do not take it more often than directed. A special MedGuide will be given to you by the pharmacist with each prescription and refill. Be sure to read this information carefully each time. Talk to your pediatrician regarding the use of this medicine in children. This medicine is not approved for use in children. Overdosage: If you think you have taken too much of this medicine contact a poison control center or emergency room at once. NOTE: This medicine is only for you. Do not share this medicine with others. What if I miss a dose? If you miss a dose, take it as soon as you can. If it is almost time for your next dose, take only that dose. Do not take double or extra doses. What may interact with this medicine? Do not take this medicine with any of the following medications:  alcohol  antihistamines for allergy, cough and cold  certain medicines for anxiety or sleep  certain medicines for depression like amitriptyline, fluoxetine, sertraline  certain medicines for seizures like carbamazepine, phenobarbital, phenytoin, primidone  general anesthetics  like halothane, isoflurane, methoxyflurane, propofol  local anesthetics like lidocaine, pramoxine, tetracaine  MAOIs like Carbex, Eldepryl, Marplan, Nardil, and Parnate  other narcotic medicines (opiates) for pain or cough  phenothiazines like chlorpromazine, mesoridazine, prochlorperazine, thioridazine This medicine may also interact with the following medications:  antiviral medicines for HIV and AIDS  atropine  certain antibiotics like clarithromycin, erythromycin  certain medicines for bladder problems like oxybutynin, tolterodine  certain medicines for fungal infections like ketoconazole and itraconazole  certain medicines for Parkinson's disease like benztropine, trihexyphenidyl  certain medicines for  stomach problems like dicyclomine, hyoscyamine  certain medicines for travel sickness like scopolamine  ipratropium  rifampin This list may not describe all possible interactions. Give your health care provider a list of all the medicines, herbs, non-prescription drugs, or dietary supplements you use. Also tell them if you smoke, drink alcohol, or use illegal drugs. Some items may interact with your medicine. What should I watch for while using this medicine? Use exactly as directed by your doctor or health care professional. Do not take more than the recommended dose. You may develop tolerance to this medicine if you take it for a long time. Tolerance means that you will get less cough relief with time. Tell your doctor or health care professional if your symptoms do not improve or if they get worse. If you have been taking this medicine for a long time, do not suddenly stop taking it because you may develop a severe reaction. Your body becomes used to the medicine. This does NOT mean you are addicted. Addiction is a behavior related to getting and using a drug for a nonmedical reason. If your doctor wants you to stop the medicine, the dose will be slowly lowered over time to avoid any side effects. There are different types of narcotic medicines (opiates). If you take more than one type at the same time or if you are taking another medicine that also causes drowsiness, you may have more side effects. Give your health care provider a list of all medicines you use. Your doctor will tell you how much medicine to take. Do not take more medicine than directed. Call emergency for help if you have problems breathing or unusual sleepiness. You may get drowsy or dizzy. Do not drive, use machinery, or do anything that needs mental alertness until you know how this medicine affects you. Do not stand or sit up quickly, especially if you are an older patient. This reduces the risk of dizzy or fainting spells.  Alcohol may interfere with the effect of this medicine. Avoid alcoholic drinks. This medicine will cause constipation. Try to have a bowel movement at least every 2 to 3 days. If you do not have a bowel movement for 3 days, call your doctor or health care professional. Your mouth may get dry. Chewing sugarless gum or sucking hard candy, and drinking plenty of water may help. Contact your doctor if the problem does not go away or is severe. What side effects may I notice from receiving this medicine? Side effects that you should report to your doctor or health care professional as soon as possible:  allergic reactions like skin rash, itching or hives, swelling of the face, lips, or tongue  breathing problems  confusion  signs and symptoms of low blood pressure like dizziness; feeling faint or lightheaded, falls; unusually weak or tired  trouble passing urine or change in the amount of urine Side effects that usually do not require medical attention (report to  your doctor or health care professional if they continue or are bothersome):  constipation  dry mouth  nausea, vomiting  tiredness This list may not describe all possible side effects. Call your doctor for medical advice about side effects. You may report side effects to FDA at 1-800-FDA-1088. Where should I keep my medicine? Keep out of the reach of children. This medicine can be abused. Keep your medicine in a safe place to protect it from theft. Do not share this medicine with anyone. Selling or giving away this medicine is dangerous and against the law. This medicine may cause accidental overdose and death if taken by other adults, children, or pets. Mix any unused medicine with a substance like cat littler or coffee grounds. Then throw the medicine away in a sealed container like a sealed bag or a coffee can with a lid. Do not use the medicine after the expiration date. Store at room temperature between 15 and 30 degrees C (59  and 86 degrees F). Protect from light. NOTE: This sheet is a summary. It may not cover all possible information. If you have questions about this medicine, talk to your doctor, pharmacist, or health care provider.  2020 Elsevier/Gold Standard (2017-06-11 16:00:40)

## 2020-07-04 NOTE — Progress Notes (Signed)
Patient [facility] Follow Up  Subjective:  Patient ID: Ruben Rivas, male    DOB: 08/04/1961  Age: 59 y.o. MRN: [id number]  CC:  Chief Complaint  Patient presents with  . Follow-up    Pt states he is here for a f/u after a hospital visit 0n 06/27/20 fro upper respiratory virus.    HPI Ruben Rivas is a 59 year old male who presents for Hospital Follow Up today.    Patient Active Problem List   Diagnosis Date Noted  . Vitamin B12 deficiency 06/22/2020  . History of stroke 12/21/2019  . Hypertension 12/21/2019  . Mixed hyperlipidemia 12/21/2019  . Chronic pain of left ankle 12/21/2019  . Vitamin D deficiency 12/21/2019  . Urinary frequency 12/22/2018  . Pain in joint, ankle and foot 02/18/2016    Past Medical History:  Diagnosis Date  . Arthritis   . Chronic pain of left ankle   . Erectile dysfunction 05/2020  . GERD (gastroesophageal reflux disease)   . Hyperlipidemia 07/2019  . Hypertension   . Stroke ([place])    2001  . Substance abuse ([place])   . Tuberculosis    treated in 2004  . Vitamin D deficiency 07/2019   Current Status: Since his last office visit, he is doing well with no complaints. He states that he has had productive cough X 1 week now. He has not taken any medication for relief of symptoms. No chest pain, heart palpitations, and shortness of breath reported. Denies GI problems such as nausea, vomiting, diarrhea, and constipation. He has no reports of blood in stools, dysuria and hematuria. He denies visual changes, chest pain, cough, shortness of breath, heart palpitations, and falls. He has occasional headaches and dizziness with position changes. Denies severe headaches, confusion, seizures, double vision, and blurred vision. He denies fevers, chills, fatigue, recent infections, weight loss, and night sweats. He has not had any, falls.  No depression or anxiety reported today. He is taking all  medications as prescribed. He denies pain today.   Past Surgical History:  Procedure Laterality Date  . ANKLE FRACTURE SURGERY  2001    Family History  Problem Relation Age of Onset  . Diabetes Mother   . Cancer Father   . Stomach cancer Father   . Esophageal cancer Brother   . Liver cancer Brother   . Rectal cancer Brother   . Colon cancer Neg Hx   . Pancreatic cancer Neg Hx   . Colon polyps Neg Hx     Social History   Socioeconomic History  . Marital status: Single    Spouse name: Not on file  . Number of children: Not on file  . Years of education: Not on file  . Highest education level: Not on file  Occupational History  . Not on file  Tobacco Use  . Smoking status: Former Smoker    Quit date: 06/27/2016    Years since quitting: 4.0  . Smokeless tobacco: Never Used  Vaping Use  . Vaping Use: Never used  Substance and Sexual Activity  . Alcohol use: No    Alcohol/week: 0.0 standard drinks    Comment: Quit two and a half years ago.  . Drug use: No  . Sexual activity: Yes  Other Topics Concern  . Not on file  Social History Narrative  . Not on file   Social Determinants of Health   Financial Resource Strain:   . Difficulty of  Paying Living Expenses:   Food Insecurity:   . Worried About Charity fundraiser in the Last Year:   . Arboriculturist in the Last Year:   Transportation Needs:   . Film/video editor (Medical):   Marland Kitchen Lack of Transportation (Non-Medical):   Physical Activity:   . Days of Exercise per Week:   . Minutes of Exercise per Session:   Stress:   . Feeling of Stress :   Social Connections:   . Frequency of Communication with Friends and Family:   . Frequency of Social Gatherings with Friends and Family:   . Attends Religious Services:   . Active Member of Clubs or Organizations:   . Attends Archivist Meetings:   Marland Kitchen Marital Status:   Intimate Partner Violence:   . Fear of Current or Ex-Partner:   . Emotionally Abused:     Marland Kitchen Physically Abused:   . Sexually Abused:     Outpatient Medications Prior to Visit  Medication Sig Dispense Refill  . simvastatin (ZOCOR) 10 MG tablet Take 1 tablet (10 mg total) by mouth at bedtime. 90 tablet 3  . Vitamin D, Ergocalciferol, (DRISDOL) 1.25 MG (50000 UNIT) CAPS capsule TAKE 1 CAPSULE BY MOUTH EVERY 7 DAYS 5 capsule 6  . sildenafil (VIAGRA) 100 MG tablet Take 0.5-1 tablets (50-100 mg total) by mouth daily as needed for erectile dysfunction. (Patient not taking: Reported on 07/04/2020) 5 tablet 11   No facility-administered medications prior to visit.    No Known Allergies  ROS Review of Systems  Constitutional: Negative.   HENT: Negative.   Eyes: Negative.   Respiratory: Positive for cough.   Cardiovascular: Negative.   Gastrointestinal: Negative.   Endocrine: Negative.   Genitourinary: Negative.   Musculoskeletal: Negative.   Skin: Negative.   Allergic/Immunologic: Negative.   Neurological: Positive for dizziness (occasional ) and headaches (occasional ).  Hematological: Negative.   Psychiatric/Behavioral: Negative.       Objective:    Physical Exam Nursing note reviewed.  Constitutional:      Appearance: Normal appearance.  HENT:     Head: Normocephalic and atraumatic.     Nose: Nose normal.     Mouth/Throat:     Mouth: Mucous membranes are moist.     Pharynx: Oropharynx is clear.  Cardiovascular:     Rate and Rhythm: Normal rate and regular rhythm.     Pulses: Normal pulses.     Heart sounds: Normal heart sounds.  Pulmonary:     Effort: Pulmonary effort is normal.     Breath sounds: Normal breath sounds.  Abdominal:     General: Bowel sounds are normal.     Palpations: Abdomen is soft.  Musculoskeletal:        General: Normal range of motion.     Cervical back: Normal range of motion and neck supple.  Skin:    General: Skin is warm and dry.  Neurological:     General: No focal deficit present.     Mental Status: He is alert and  oriented to person, place, and time.  Psychiatric:        Mood and Affect: Mood normal.        Behavior: Behavior normal.        Thought Content: Thought content normal.        Judgment: Judgment normal.     BP (!) 137/100 (BP Location: Left Arm, Patient Position: Sitting, Cuff Size: Normal)   Pulse 66  Temp 97.9 F (36.6 C)   Resp 18   Ht 6' (1.829 m)   Wt 194 lb 6.4 oz (88.2 kg)   SpO2 100%   BMI 26.37 kg/m  Wt Readings from Last 3 Encounters:  07/04/20 194 lb 6.4 oz (88.2 kg)  06/27/20 192 lb (87.1 kg)  06/19/20 191 lb 9.6 oz (86.9 kg)     Health Maintenance Due  Topic Date Due  . COVID-19 Vaccine (1) Never done  . INFLUENZA VACCINE  07/01/2020    There are no preventive care reminders to display for this patient.  Lab Results  Component Value Date   TSH 0.870 06/19/2020   Lab Results  Component Value Date   WBC 4.8 06/19/2020   HGB 14.5 06/19/2020   HCT 43.3 06/19/2020   MCV 84 06/19/2020   PLT 283 06/19/2020   Lab Results  Component Value Date   NA 138 06/19/2020   K 4.9 06/19/2020   CO2 24 06/19/2020   GLUCOSE 96 06/19/2020   BUN 12 06/19/2020   CREATININE 0.88 06/19/2020   BILITOT 0.3 06/19/2020   ALKPHOS 75 06/19/2020   AST 22 06/19/2020   ALT 25 06/19/2020   PROT 7.9 06/19/2020   ALBUMIN 4.5 06/19/2020   CALCIUM 9.9 06/19/2020   Lab Results  Component Value Date   CHOL 216 (H) 06/19/2020   Lab Results  Component Value Date   HDL 41 06/19/2020   Lab Results  Component Value Date   LDLCALC 157 (H) 06/19/2020   Lab Results  Component Value Date   TRIG 101 06/19/2020   Lab Results  Component Value Date   CHOLHDL 5.3 (H) 06/19/2020   Lab Results  Component Value Date   HGBA1C 5.7 (A) 06/19/2020   HGBA1C 5.7 06/19/2020   HGBA1C 5.7 06/19/2020   HGBA1C 5.7 06/19/2020      Assessment & Plan:   1. Viral infection - amoxicillin-clavulanate (AUGMENTIN) 875-125 MG tablet; Take 1 tablet by mouth 2 (two) times daily for 10  days.  Dispense: 20 tablet; Refill: 0 - HYDROcodone-homatropine (HYCODAN) 5-1.5 MG/5ML syrup; Take 5 mLs by mouth every 8 (eight) hours as needed for cough.  Dispense: 120 mL; Refill: 0  2. Cough productive of clear sputum - amoxicillin-clavulanate (AUGMENTIN) 875-125 MG tablet; Take 1 tablet by mouth 2 (two) times daily for 10 days.  Dispense: 20 tablet; Refill: 0 - HYDROcodone-homatropine (HYCODAN) 5-1.5 MG/5ML syrup; Take 5 mLs by mouth every 8 (eight) hours as needed for cough.  Dispense: 120 mL; Refill: 0  3. Follow up He will keep follow up appointment as scheduled.  - Urinalysis Dipstick  Orders Placed This Encounter  Procedures  . Urinalysis Dipstick    Meds ordered this encounter  Medications  . amoxicillin-clavulanate (AUGMENTIN) 875-125 MG tablet    Sig: Take 1 tablet by mouth 2 (two) times daily for 10 days.    Dispense:  20 tablet    Refill:  0  . HYDROcodone-homatropine (HYCODAN) 5-1.5 MG/5ML syrup    Sig: Take 5 mLs by mouth every 8 (eight) hours as needed for cough.    Dispense:  120 mL    Refill:  0    Order Specific Question:   Supervising Provider    Answer:   Tresa Garter [[id number]]   Referral Orders  No referral(s) requested today     Kathe Becton,  MSN, FNP-BC [facility], [place]  [address] [phone]- fax  Orders Placed This Encounter  Procedures  . Urinalysis Dipstick    Referral Orders  No referral(s) requested today     Problem List Items Addressed This Visit    None    Visit Diagnoses    Viral infection    -  Primary   Relevant Medications   amoxicillin-clavulanate (AUGMENTIN) 875-125 MG tablet   HYDROcodone-homatropine (HYCODAN) 5-1.5 MG/5ML syrup   Cough productive of clear sputum       Relevant Medications   amoxicillin-clavulanate (AUGMENTIN) 875-125 MG tablet   HYDROcodone-homatropine  (HYCODAN) 5-1.5 MG/5ML syrup   Follow up       Relevant Orders   Urinalysis Dipstick (Completed)      Meds ordered this encounter  Medications  . amoxicillin-clavulanate (AUGMENTIN) 875-125 MG tablet    Sig: Take 1 tablet by mouth 2 (two) times daily for 10 days.    Dispense:  20 tablet    Refill:  0  . HYDROcodone-homatropine (HYCODAN) 5-1.5 MG/5ML syrup    Sig: Take 5 mLs by mouth every 8 (eight) hours as needed for cough.    Dispense:  120 mL    Refill:  0    Order Specific Question:   Supervising Provider    Answer:   Tresa Garter [[id number]]    Follow-up: No follow-ups on file.    Azzie Glatter, FNP

## 2020-07-05 ENCOUNTER — Encounter: Payer: Self-pay | Admitting: Family Medicine

## 2020-07-10 ENCOUNTER — Telehealth: Payer: Self-pay

## 2020-07-10 DIAGNOSIS — M47816 Spondylosis without myelopathy or radiculopathy, lumbar region: Secondary | ICD-10-CM | POA: Diagnosis not present

## 2020-07-10 DIAGNOSIS — Z79899 Other long term (current) drug therapy: Secondary | ICD-10-CM | POA: Diagnosis not present

## 2020-07-10 DIAGNOSIS — M62838 Other muscle spasm: Secondary | ICD-10-CM | POA: Diagnosis not present

## 2020-07-10 DIAGNOSIS — M25572 Pain in left ankle and joints of left foot: Secondary | ICD-10-CM | POA: Diagnosis not present

## 2020-07-10 NOTE — Telephone Encounter (Signed)
-----   Message from Azzie Glatter, [place] sent at 06/19/2020 10:07 PM EDT ----- Regarding: "Medical Records" Please request that Medical Records to be faxed to our office from [facility] on [address] Thank you.

## 2020-08-05 ENCOUNTER — Other Ambulatory Visit: Payer: Self-pay | Admitting: Family Medicine

## 2020-08-05 DIAGNOSIS — E559 Vitamin D deficiency, unspecified: Secondary | ICD-10-CM

## 2020-08-05 DIAGNOSIS — E782 Mixed hyperlipidemia: Secondary | ICD-10-CM

## 2020-08-07 DIAGNOSIS — M47816 Spondylosis without myelopathy or radiculopathy, lumbar region: Secondary | ICD-10-CM | POA: Diagnosis not present

## 2020-08-07 DIAGNOSIS — Z79899 Other long term (current) drug therapy: Secondary | ICD-10-CM | POA: Diagnosis not present

## 2020-08-07 DIAGNOSIS — M62838 Other muscle spasm: Secondary | ICD-10-CM | POA: Diagnosis not present

## 2020-08-07 DIAGNOSIS — M25572 Pain in left ankle and joints of left foot: Secondary | ICD-10-CM | POA: Diagnosis not present

## 2020-09-05 DIAGNOSIS — M25572 Pain in left ankle and joints of left foot: Secondary | ICD-10-CM | POA: Diagnosis not present

## 2020-09-05 DIAGNOSIS — Z79899 Other long term (current) drug therapy: Secondary | ICD-10-CM | POA: Diagnosis not present

## 2020-09-05 DIAGNOSIS — M62838 Other muscle spasm: Secondary | ICD-10-CM | POA: Diagnosis not present

## 2020-09-05 DIAGNOSIS — M47816 Spondylosis without myelopathy or radiculopathy, lumbar region: Secondary | ICD-10-CM | POA: Diagnosis not present

## 2020-09-18 ENCOUNTER — Other Ambulatory Visit: Payer: Self-pay | Admitting: Family Medicine

## 2020-09-18 DIAGNOSIS — E559 Vitamin D deficiency, unspecified: Secondary | ICD-10-CM

## 2020-10-09 DIAGNOSIS — M25572 Pain in left ankle and joints of left foot: Secondary | ICD-10-CM | POA: Diagnosis not present

## 2020-10-09 DIAGNOSIS — E559 Vitamin D deficiency, unspecified: Secondary | ICD-10-CM | POA: Diagnosis not present

## 2020-10-09 DIAGNOSIS — M47816 Spondylosis without myelopathy or radiculopathy, lumbar region: Secondary | ICD-10-CM | POA: Diagnosis not present

## 2020-10-09 DIAGNOSIS — Z1159 Encounter for screening for other viral diseases: Secondary | ICD-10-CM | POA: Diagnosis not present

## 2020-10-09 DIAGNOSIS — Z79899 Other long term (current) drug therapy: Secondary | ICD-10-CM | POA: Diagnosis not present

## 2020-10-09 DIAGNOSIS — M62838 Other muscle spasm: Secondary | ICD-10-CM | POA: Diagnosis not present

## 2020-10-09 DIAGNOSIS — M129 Arthropathy, unspecified: Secondary | ICD-10-CM | POA: Diagnosis not present

## 2020-10-09 DIAGNOSIS — Z23 Encounter for immunization: Secondary | ICD-10-CM | POA: Diagnosis not present

## 2020-11-13 DIAGNOSIS — M62838 Other muscle spasm: Secondary | ICD-10-CM | POA: Diagnosis not present

## 2020-11-13 DIAGNOSIS — Z79899 Other long term (current) drug therapy: Secondary | ICD-10-CM | POA: Diagnosis not present

## 2020-11-13 DIAGNOSIS — M47816 Spondylosis without myelopathy or radiculopathy, lumbar region: Secondary | ICD-10-CM | POA: Diagnosis not present

## 2020-11-13 DIAGNOSIS — M25572 Pain in left ankle and joints of left foot: Secondary | ICD-10-CM | POA: Diagnosis not present

## 2020-12-18 ENCOUNTER — Ambulatory Visit: Payer: Self-pay | Admitting: Family Medicine

## 2021-06-07 ENCOUNTER — Other Ambulatory Visit: Payer: Self-pay

## 2021-06-07 ENCOUNTER — Encounter (HOSPITAL_COMMUNITY): Payer: Self-pay | Admitting: Emergency Medicine

## 2021-06-07 ENCOUNTER — Emergency Department (HOSPITAL_COMMUNITY)
Admission: EM | Admit: 2021-06-07 | Discharge: 2021-06-07 | Disposition: A | Discharge status: Home / Self Care | Payer: Medicare HMO | Attending: Emergency Medicine | Admitting: Emergency Medicine

## 2021-06-07 DIAGNOSIS — Z5321 Procedure and treatment not carried out due to patient leaving prior to being seen by health care provider: Secondary | ICD-10-CM | POA: Diagnosis not present

## 2021-06-07 DIAGNOSIS — Y929 Unspecified place or not applicable: Secondary | ICD-10-CM | POA: Insufficient documentation

## 2021-06-07 DIAGNOSIS — W228XXA Striking against or struck by other objects, initial encounter: Secondary | ICD-10-CM | POA: Insufficient documentation

## 2021-06-07 DIAGNOSIS — Y939 Activity, unspecified: Secondary | ICD-10-CM | POA: Insufficient documentation

## 2021-06-07 DIAGNOSIS — S01511A Laceration without foreign body of lip, initial encounter: Secondary | ICD-10-CM | POA: Diagnosis not present

## 2021-06-07 DIAGNOSIS — Y990 Civilian activity done for income or pay: Secondary | ICD-10-CM | POA: Insufficient documentation

## 2021-06-07 NOTE — ED Provider Notes (Cosign Needed)
Emergency Medicine Provider Triage Evaluation Note  Ruben Rivas , a 60 y.o. male  was evaluated in triage.  Pt complains of lip laceration that occurred 1 hour ago states that this occurred at work when a metal chain slapped in the mouth.  Denies any other injuries to his face or body.  States the pain is achy and constant.  Review of Systems  Positive: Lip pain and lacerations Negative: Chest pain  Physical Exam  BP (!) 139/92   Pulse 73   Temp 98.7 F (37.1 C) (Oral)   Resp 14   SpO2 95%  Gen:   Awake, no distress   Resp:  Normal effort  MSK:   Moves extremities without difficulty  Other:  Small but gaping upper lip laceration just left of midline crosses vermilion border.  Medical Decision Making  Medically screening exam initiated at 1:18 PM.  Appropriate orders placed.  Ruben Rivas was informed that the remainder of the evaluation will be completed by another provider, this initial triage assessment does not replace that evaluation, and the importance of remaining in the ED until their evaluation is complete.  Patient states that his lip laceration will require least 1 stitch given the process of vermilion border.  He is agreeable to staying in the ER.  States that he thinks that he is up-to-date on Tdap but is not completely certain.  States that he had stitches placed in the ER several years ago at [facility], [place], [place] 06/07/21 1322

## 2021-06-07 NOTE — ED Triage Notes (Signed)
Patient here after a metal chain on a machine at his workplace hit him on his top lip. No loose teeth. Patient alert, oriented, and in no apparent distress at this time.

## 2021-06-07 NOTE — ED Notes (Signed)
Called for patient but no response X2

## 2021-06-07 NOTE — ED Notes (Signed)
Called again for patient, No response again.And did last call.

## 2021-10-29 IMAGING — DX DG CHEST 2V
2 series · 2 of 2 positions shown · non-contrast
Comparison: None.

CLINICAL DATA: chest cold that started on [REDACTED] and he has cough
and runny nose. Pt states chest hurts only when he coughs.

EXAM:
CHEST - 2 VIEW

[chest pa]
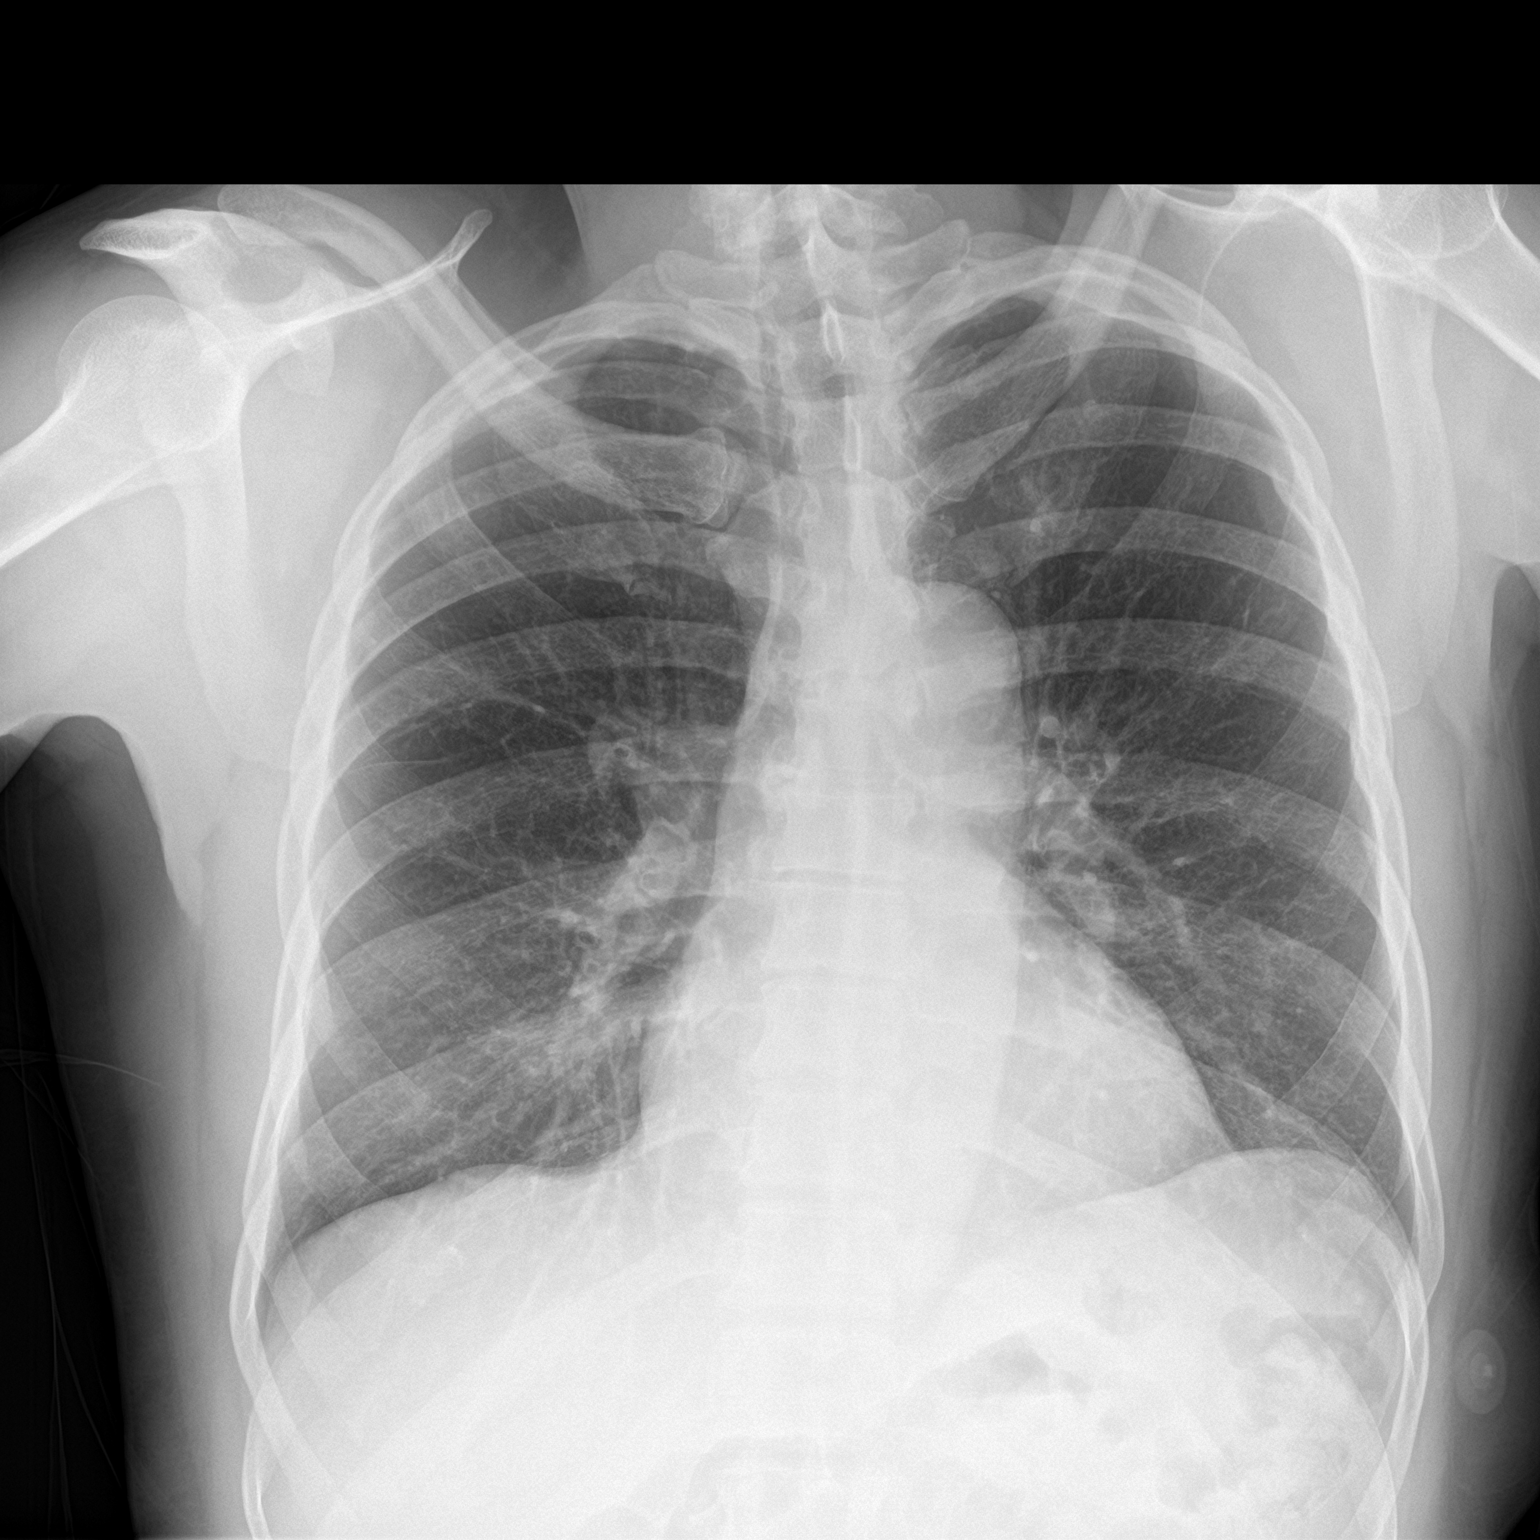

[chest lat]
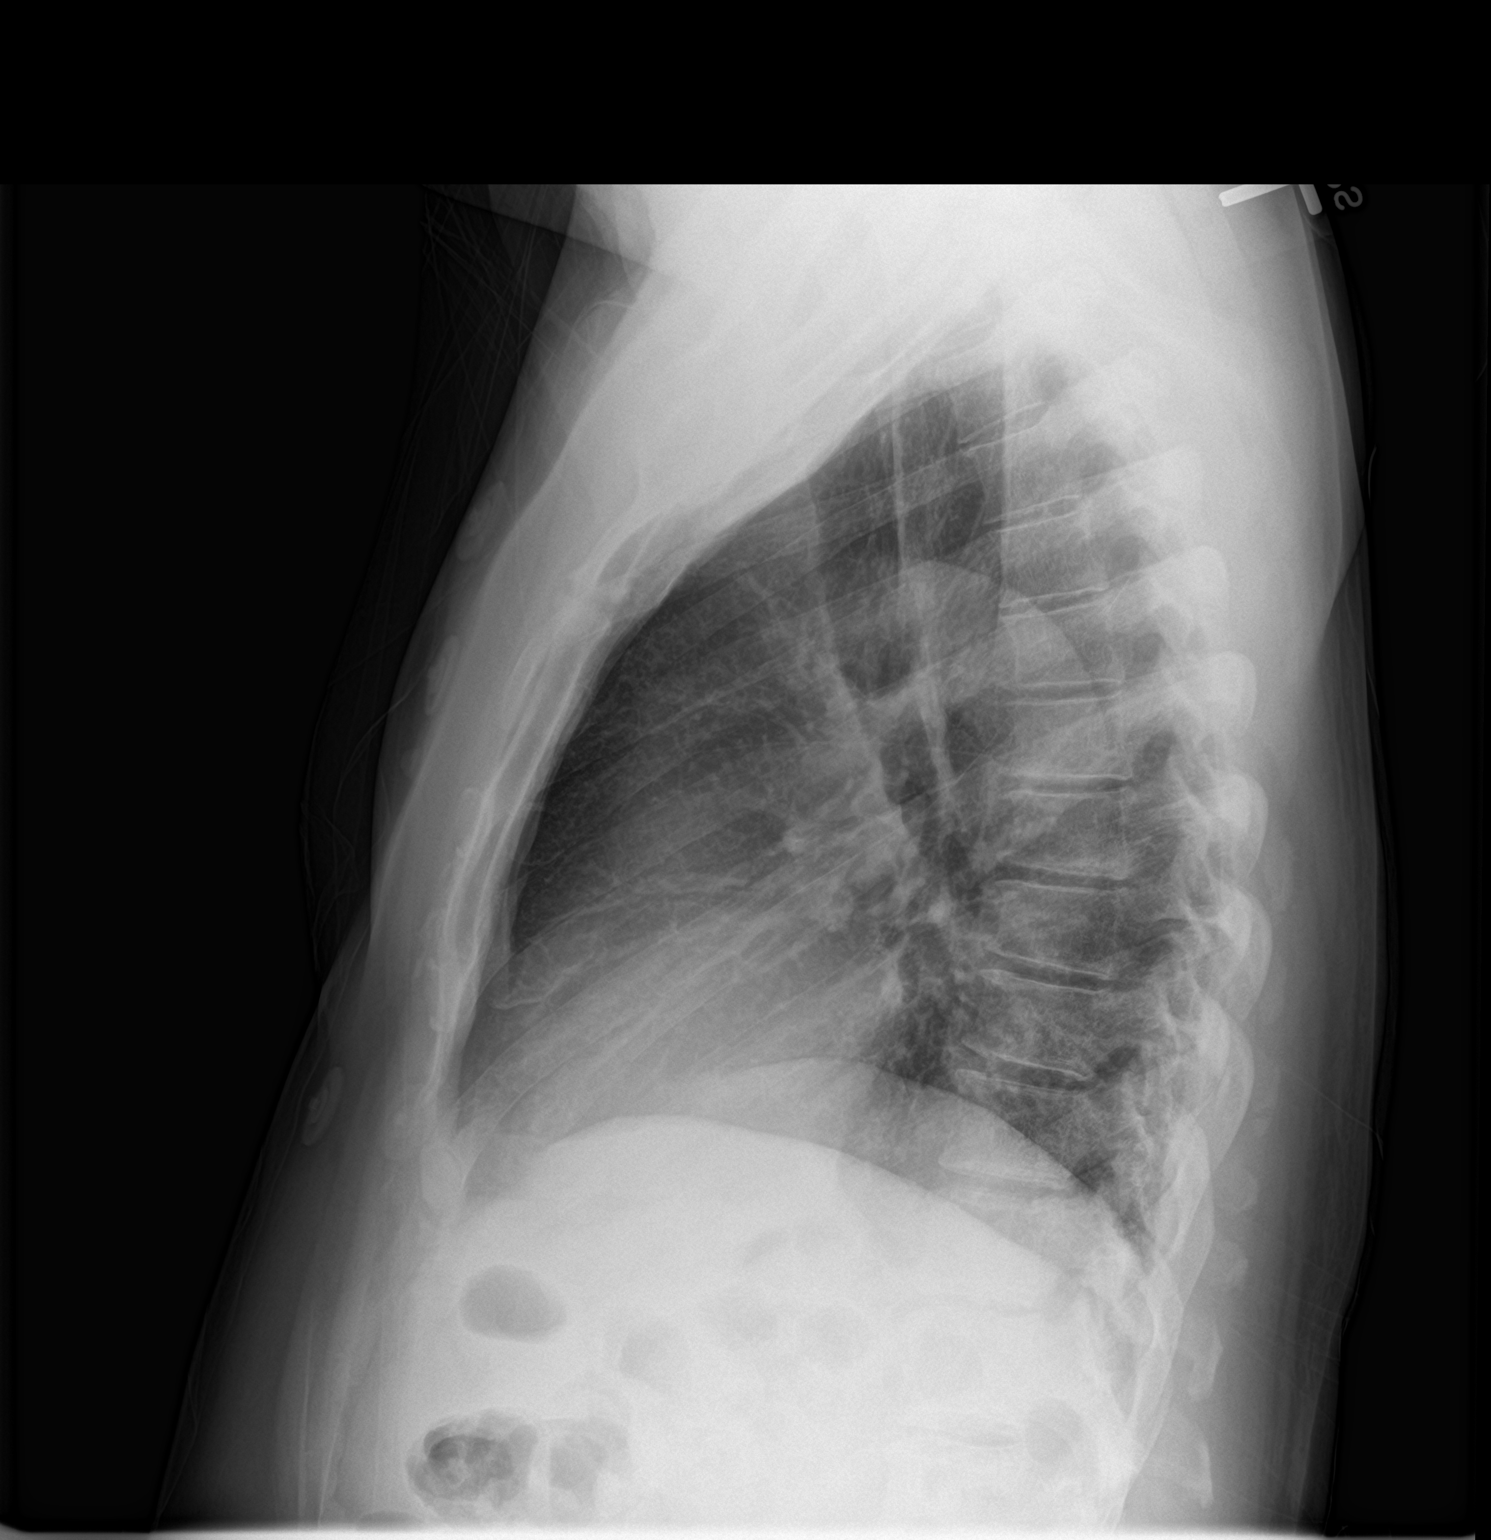

[2 of 2 positions shown; findings below may reference images not displayed]

FINDINGS: The heart size and mediastinal contours are within normal limits.
Both lungs are clear. No pleural effusion or pneumothorax. Old
healed left-sided rib fractures. No acute skeletal abnormality.
IMPRESSION: No active cardiopulmonary disease.

## 2022-07-10 ENCOUNTER — Ambulatory Visit: Payer: Medicare HMO | Admitting: Nurse Practitioner

## 2022-08-17 ENCOUNTER — Encounter (HOSPITAL_COMMUNITY): Payer: Self-pay | Admitting: Emergency Medicine

## 2022-08-17 ENCOUNTER — Ambulatory Visit (HOSPITAL_COMMUNITY)
Admission: EM | Admit: 2022-08-17 | Discharge: 2022-08-17 | Disposition: A | Discharge status: Home / Self Care | Payer: Medicare Other | Attending: Physician Assistant | Admitting: Physician Assistant

## 2022-08-17 DIAGNOSIS — R051 Acute cough: Secondary | ICD-10-CM | POA: Diagnosis present

## 2022-08-17 DIAGNOSIS — Z1152 Encounter for screening for COVID-19: Secondary | ICD-10-CM | POA: Diagnosis present

## 2022-08-17 DIAGNOSIS — J069 Acute upper respiratory infection, unspecified: Secondary | ICD-10-CM | POA: Diagnosis present

## 2022-08-17 LAB — SARS CORONAVIRUS 2 BY RT PCR: SARS Coronavirus 2 by RT PCR: NEGATIVE

## 2022-08-17 NOTE — ED Triage Notes (Signed)
Pt reports that he did a home covid test yesterday and "failed it" so wants a test to se if really positive.

## 2022-08-17 NOTE — Discharge Instructions (Signed)
COVID test will be completed in 2 hours, if you do not get a call from this office then that indicates the test is negative, you can go on MyChart to view the results in 4 hours or less. Advised to treat the symptoms, Tylenol or Motrin for fever aches or pains, Mucinex for cough and congestion. Advised to continue masking in order to protect others until you know the results of the test. Vies to follow-up PCP return to urgent care if symptoms fail to improve.

## 2022-08-17 NOTE — ED Provider Notes (Signed)
[facility]    CSN: [id number] Arrival date & time: 08/17/22  1101      History   Chief Complaint Chief Complaint  Patient presents with   Covid Positive    HPI Ruben Rivas is a 62 y.o. male.   61 year old male who presents with cough and congestion.  Patient relates that yesterday he was talking with his wife and she indicates she has had some cough congestion and she did a COVID test which is positive.  The patient indicates he decided to do a COVID test also and the results of his was positive also.  Patient indicates that he has had over the past 2 days mild chest congestion and cough, no real production.  Patient indicates he has not have any fever, chills, body aches or pain.  Patient indicates that he is eating and drinking fluids well. Patient indicates that he is concerned because he has a 61 year old mother that he sees on a regular basis, he is due to see her on Monday and he needs to know if he is COVID-positive or not so that he can take proper precautions.     Past Medical History:  Diagnosis Date   Arthritis    Chronic pain of left ankle    Erectile dysfunction 05/2020   GERD (gastroesophageal reflux disease)    Hyperlipidemia 07/2019   Hypertension    Stroke ([place])    2001   Substance abuse ([place])    Tuberculosis    treated in 2004   Vitamin D deficiency 07/2019    Patient Active Problem List   Diagnosis Date Noted   Vitamin B12 deficiency 06/22/2020   History of stroke 12/21/2019   Hypertension 12/21/2019   Mixed hyperlipidemia 12/21/2019   Chronic pain of left ankle 12/21/2019   Vitamin D deficiency 12/21/2019   Urinary frequency 12/22/2018   Pain in joint, ankle and foot 02/18/2016    Past Surgical History:  Procedure Laterality Date   ANKLE FRACTURE SURGERY  2001       Home Medications    Prior to Admission medications   Medication Sig Start Date End Date Taking? Authorizing Provider  HYDROcodone-homatropine (HYCODAN)  5-1.5 MG/5ML syrup Take 5 mLs by mouth every 8 (eight) hours as needed for cough. 07/04/20   Azzie Glatter, FNP  sildenafil (VIAGRA) 100 MG tablet Take 0.5-1 tablets (50-100 mg total) by mouth daily as needed for erectile dysfunction. Patient not taking: Reported on 07/04/2020 06/19/20   Azzie Glatter, FNP  simvastatin (ZOCOR) 10 MG tablet TAKE 1 TABLET(10 MG) BY MOUTH AT BEDTIME 08/17/20   Azzie Glatter, FNP  Vitamin D, Ergocalciferol, (DRISDOL) 1.25 MG (50000 UNIT) CAPS capsule TAKE 1 CAPSULE BY MOUTH EVERY 7 DAYS 09/26/20   Azzie Glatter, FNP    Family History Family History  Problem Relation Age of Onset   Diabetes Mother    Cancer Father    Stomach cancer Father    Esophageal cancer Brother    Liver cancer Brother    Rectal cancer Brother    Colon cancer Neg Hx    Pancreatic cancer Neg Hx    Colon polyps Neg Hx     Social History Social History   Tobacco Use   Smoking status: Former    Types: Cigarettes    Quit date: 06/27/2016    Years since quitting: 6.1   Smokeless tobacco: Never  Vaping Use   Vaping Use: Never used  Substance Use Topics   Alcohol  use: No    Alcohol/week: 0.0 standard drinks of alcohol    Comment: Quit two and a half years ago.   Drug use: No     Allergies   Patient has no known allergies.   Review of Systems Review of Systems  HENT:  Positive for postnasal drip.   Respiratory:  Positive for cough.      Physical Exam Triage Vital Signs ED Triage Vitals [08/17/22 1152]  Enc Vitals Group     BP 135/86     Pulse Rate (!) 59     Resp 15     Temp 97.8 F (36.6 C)     Temp Source Oral     SpO2 99 %     Weight      Height      Head Circumference      Peak Flow      Pain Score 0     Pain Loc      Pain Edu?      Excl. in [place]?    No data found.  Updated Vital Signs BP 135/86 (BP Location: Left Arm)   Pulse (!) 59   Temp 97.8 F (36.6 C) (Oral)   Resp 15   SpO2 99%   Visual Acuity Right Eye Distance:   Left Eye  Distance:   Bilateral Distance:    Right Eye Near:   Left Eye Near:    Bilateral Near:     Physical Exam Constitutional:      Appearance: Normal appearance.  HENT:     Right Ear: Tympanic membrane and ear canal normal.     Left Ear: Tympanic membrane and ear canal normal.     Mouth/Throat:     Mouth: Mucous membranes are moist.     Pharynx: Oropharynx is clear.  Cardiovascular:     Rate and Rhythm: Normal rate and regular rhythm.     Heart sounds: Normal heart sounds.  Pulmonary:     Effort: Pulmonary effort is normal.     Breath sounds: Normal breath sounds and air entry. No wheezing, rhonchi or rales.  Lymphadenopathy:     Cervical: No cervical adenopathy.  Neurological:     Mental Status: He is alert.      UC Treatments / Results  Labs (all labs ordered are listed, but only abnormal results are displayed) Labs Reviewed  SARS CORONAVIRUS 2 BY RT PCR    EKG   Radiology No results found.  Procedures Procedures (including critical care time)  Medications Ordered in UC Medications - No data to display  Initial Impression / Assessment and Plan / UC Course  I have reviewed the triage vital signs and the nursing notes.  Pertinent labs & imaging results that were available during my care of the patient were reviewed by me and considered in my medical decision making (see chart for details).       Plan: 1.  COVID test is pending. 2.  Advised to treat the symptoms, Tylenol or ibuprofen for fever aches or pains, Mucinex for cough and congestion. 3.  Advised to follow-up with PCP or return to urgent care if symptoms fail to improve. Final Clinical Impressions(s) / UC Diagnoses   Final diagnoses:  Acute upper respiratory infection  Acute cough  Encounter for screening for COVID-19     Discharge Instructions      COVID test will be completed in 2 hours, if you do not get a call from this office then that indicates the  test is negative, you can go on  MyChart to view the results in 4 hours or less. Advised to treat the symptoms, Tylenol or Motrin for fever aches or pains, Mucinex for cough and congestion. Advised to continue masking in order to protect others until you know the results of the test. Vies to follow-up PCP return to urgent care if symptoms fail to improve.     ED Prescriptions   None    PDMP not reviewed this encounter.   Nyoka Lint, PA-C 08/17/22 1233

## 2023-03-06 ENCOUNTER — Encounter (HOSPITAL_COMMUNITY): Payer: Self-pay

## 2023-03-06 ENCOUNTER — Ambulatory Visit (HOSPITAL_COMMUNITY)
Admission: EM | Admit: 2023-03-06 | Discharge: 2023-03-06 | Disposition: A | Discharge status: Home / Self Care | Payer: Medicare HMO | Attending: Urgent Care | Admitting: Urgent Care

## 2023-03-06 DIAGNOSIS — R058 Other specified cough: Secondary | ICD-10-CM | POA: Diagnosis not present

## 2023-03-06 DIAGNOSIS — B349 Viral infection, unspecified: Secondary | ICD-10-CM | POA: Insufficient documentation

## 2023-03-06 DIAGNOSIS — Z79899 Other long term (current) drug therapy: Secondary | ICD-10-CM | POA: Diagnosis not present

## 2023-03-06 DIAGNOSIS — Z1152 Encounter for screening for COVID-19: Secondary | ICD-10-CM | POA: Insufficient documentation

## 2023-03-06 MED ORDER — CETIRIZINE HCL 10 MG PO TABS: 10.0000 mg | ORAL_TABLET | Freq: Every day | ORAL | 0 refills | Status: DC

## 2023-03-06 MED ORDER — PREDNISONE 10 MG PO TABS: 30.0000 mg | ORAL_TABLET | Freq: Every day | ORAL | 0 refills | Status: DC

## 2023-03-06 MED ORDER — PROMETHAZINE-DM 6.25-15 MG/5ML PO SYRP: 5.0000 mL | ORAL_SOLUTION | Freq: Three times a day (TID) | ORAL | 0 refills | Status: DC | PRN

## 2023-03-06 NOTE — ED Triage Notes (Signed)
Patient c/o a productive cough with yellow/brown sputum x 3 days. Patient states he has been taking Nyquil and the last dose was yesterday.

## 2023-03-06 NOTE — ED Provider Notes (Signed)
[facility]   MRN: [id number] DOB: 04/22/1961  Subjective:   Ruben Rivas is a 62 y.o. male presenting for 3 day history of productive cough that elicits chest pain, wheezing, runny and stuffy nose, throat pain. No smoking. Quit about 5 years ago. No asthma.   No current facility-administered medications for this encounter.  Current Outpatient Medications:    HYDROcodone-acetaminophen (NORCO) 10-325 MG tablet, Take 1 tablet by mouth every 8 (eight) hours as needed., Disp: , Rfl:    HYDROcodone-homatropine (HYCODAN) 5-1.5 MG/5ML syrup, Take 5 mLs by mouth every 8 (eight) hours as needed for cough., Disp: 120 mL, Rfl: 0   sildenafil (VIAGRA) 100 MG tablet, Take 0.5-1 tablets (50-100 mg total) by mouth daily as needed for erectile dysfunction. (Patient not taking: Reported on 07/04/2020), Disp: 5 tablet, Rfl: 11   simvastatin (ZOCOR) 10 MG tablet, TAKE 1 TABLET(10 MG) BY MOUTH AT BEDTIME, Disp: 90 tablet, Rfl: 3   Vitamin D, Ergocalciferol, (DRISDOL) 1.25 MG (50000 UNIT) CAPS capsule, TAKE 1 CAPSULE BY MOUTH EVERY 7 DAYS, Disp: 12 capsule, Rfl: 5   No Known Allergies  Past Medical History:  Diagnosis Date   Arthritis    Chronic pain of left ankle    Erectile dysfunction 05/2020   GERD (gastroesophageal reflux disease)    Hyperlipidemia 07/2019   Hypertension    Stroke    2001   Substance abuse    Tuberculosis    treated in 2004   Vitamin D deficiency 07/2019     Past Surgical History:  Procedure Laterality Date   ANKLE FRACTURE SURGERY  2001    Family History  Problem Relation Age of Onset   Diabetes Mother    Cancer Father    Stomach cancer Father    Esophageal cancer Brother    Liver cancer Brother    Rectal cancer Brother    Colon cancer Neg Hx    Pancreatic cancer Neg Hx    Colon polyps Neg Hx     Social History   Tobacco Use   Smoking status: Former    Types: Cigarettes    Quit date: 06/27/2016    Years since quitting: 6.6   Smokeless  tobacco: Never  Vaping Use   Vaping Use: Never used  Substance Use Topics   Alcohol use: No    Alcohol/week: 0.0 standard drinks of alcohol    Comment: Quit two and a half years ago.   Drug use: No    ROS   Objective:   Vitals: BP (!) 146/82 (BP Location: Right Arm)   Pulse 75   Temp 98.2 F (36.8 C) (Oral)   Resp 16   SpO2 96%   Physical Exam Constitutional:      General: He is not in acute distress.    Appearance: Normal appearance. He is well-developed and normal weight. He is not ill-appearing, toxic-appearing or diaphoretic.  HENT:     Head: Normocephalic and atraumatic.     Right Ear: Tympanic membrane, ear canal and external ear normal. No drainage, swelling or tenderness. No middle ear effusion. There is no impacted cerumen. Tympanic membrane is not erythematous or bulging.     Left Ear: Tympanic membrane, ear canal and external ear normal. No drainage, swelling or tenderness.  No middle ear effusion. There is no impacted cerumen. Tympanic membrane is not erythematous or bulging.     Nose: Congestion and rhinorrhea present.     Mouth/Throat:     Mouth: Mucous membranes  are moist.     Pharynx: No oropharyngeal exudate or posterior oropharyngeal erythema.  Eyes:     General: No scleral icterus.       Right eye: No discharge.        Left eye: No discharge.     Extraocular Movements: Extraocular movements intact.     Conjunctiva/sclera: Conjunctivae normal.  Cardiovascular:     Rate and Rhythm: Normal rate and regular rhythm.     Heart sounds: Normal heart sounds. No murmur heard.    No friction rub. No gallop.  Pulmonary:     Effort: Pulmonary effort is normal. No respiratory distress.     Breath sounds: Normal breath sounds. No stridor. No wheezing, rhonchi or rales.  Musculoskeletal:     Cervical back: Normal range of motion and neck supple. No rigidity. No muscular tenderness.  Neurological:     General: No focal deficit present.     Mental Status: He is  alert and oriented to person, place, and time.  Psychiatric:        Mood and Affect: Mood normal.        Behavior: Behavior normal.        Thought Content: Thought content normal.     Assessment and Plan :   PDMP not reviewed this encounter.  1. Acute viral syndrome     Given his significant respiratory symptoms, offered an oral prednisone course. Will manage for viral illness such as viral URI, viral syndrome, viral rhinitis, COVID-19. Recommended supportive care. Offered scripts for symptomatic relief. Testing is pending. Counseled patient on potential for adverse effects with medications prescribed/recommended today, ER and return-to-clinic precautions discussed, patient verbalized understanding.   Patient should undergo Paxlovid treatment if he test positive.   Wallis Bamberg, [place] 03/06/23 [phone]

## 2023-03-06 NOTE — Discharge Instructions (Signed)
We will notify you of your test results as they arrive and may take between about 24 hours.  I encourage you to sign up for MyChart if you have not already done so as this can be the easiest way for us to communicate results to you online or through a phone app.  Generally, we only contact you if it is a positive test result.  In the meantime, if you develop worsening symptoms including fever, chest pain, shortness of breath despite our current treatment plan then please report to the emergency room as this may be a sign of worsening status from possible viral infection.  Otherwise, we will manage this as a viral syndrome. For sore throat or cough try using a honey-based tea. Use 3 teaspoons of honey with juice squeezed from half lemon. Place shaved pieces of ginger into 1/2-1 cup of water and warm over stove top. Then mix the ingredients and repeat every 4 hours as needed. Please take Tylenol 500mg-650mg every 6 hours for aches and pains, fevers. Hydrate very well with at least 2 liters of water. Eat light meals such as soups to replenish electrolytes and soft fruits, veggies. Start an antihistamine like Zyrtec (10mg daily) for postnasal drainage, sinus congestion.  You can take this together with prednisone.  Use the cough medications as needed.   

## 2023-03-07 LAB — SARS CORONAVIRUS 2 (TAT 6-24 HRS): SARS Coronavirus 2: NEGATIVE

## 2023-09-09 ENCOUNTER — Encounter (HOSPITAL_COMMUNITY): Payer: Self-pay | Admitting: Emergency Medicine

## 2023-09-09 ENCOUNTER — Ambulatory Visit (HOSPITAL_COMMUNITY)
Admission: EM | Admit: 2023-09-09 | Discharge: 2023-09-09 | Disposition: A | Discharge status: Home / Self Care | Payer: Medicare HMO | Attending: Emergency Medicine | Admitting: Emergency Medicine

## 2023-09-09 DIAGNOSIS — J069 Acute upper respiratory infection, unspecified: Secondary | ICD-10-CM | POA: Diagnosis not present

## 2023-09-09 DIAGNOSIS — U071 COVID-19: Secondary | ICD-10-CM | POA: Diagnosis not present

## 2023-09-09 DIAGNOSIS — Z87891 Personal history of nicotine dependence: Secondary | ICD-10-CM | POA: Insufficient documentation

## 2023-09-09 DIAGNOSIS — R0981 Nasal congestion: Secondary | ICD-10-CM | POA: Diagnosis present

## 2023-09-09 MED ORDER — PROMETHAZINE-DM 6.25-15 MG/5ML PO SYRP: 5.0000 mL | ORAL_SOLUTION | Freq: Four times a day (QID) | ORAL | 0 refills | Status: DC | PRN

## 2023-09-09 MED ORDER — GUAIFENESIN ER 600 MG PO TB12: 1200.0000 mg | ORAL_TABLET | Freq: Two times a day (BID) | ORAL | 0 refills | Status: AC

## 2023-09-09 NOTE — ED Provider Notes (Signed)
[facility]    CSN: [id number] Arrival date & time: 09/09/23  1105      History   Chief Complaint Chief Complaint  Patient presents with   Nasal Congestion   Cough    HPI Ruben Rivas is a 62 y.o. male.   Patient presents to clinic for complaints of congestion, cough and sore throat that started on Saturday.  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 He wore a mask at this visit.   Denies fever or body aches. Has been using OTC cough drops for ST. Denies SOB, wheezing or chest pain.       The history is provided by the patient and medical records.  Cough Associated symptoms: rhinorrhea and sore throat   Associated symptoms: no chest pain, no fever, no shortness of breath and no wheezing     Past Medical History:  Diagnosis Date   Arthritis    Chronic pain of left ankle    Erectile dysfunction 05/2020   GERD (gastroesophageal reflux disease)    Hyperlipidemia 07/2019   Hypertension    Stroke (HCC)    2001   Substance abuse (HCC)    Tuberculosis    treated in 2004   Vitamin D deficiency 07/2019    Patient Active Problem List   Diagnosis Date Noted   Vitamin B12 deficiency 06/22/2020   History of stroke 12/21/2019   Hypertension 12/21/2019   Mixed hyperlipidemia 12/21/2019   Chronic pain of left ankle 12/21/2019   Vitamin D deficiency 12/21/2019   Urinary frequency 12/22/2018   Pain in joint, ankle and foot 02/18/2016    Past Surgical History:  Procedure Laterality Date   ANKLE FRACTURE SURGERY  2001       Home Medications    Prior to Admission medications   Medication Sig Start Date End Date Taking? Authorizing Provider  guaiFENesin (MUCINEX) 600 MG 12 hr tablet Take 2 tablets (1,200 mg total) by mouth 2 (two) times daily for 5 days. 09/09/23 09/14/23 Yes Rinaldo Ratel, [place] N, FNP  oxyCODONE-acetaminophen (PERCOCET)  10-325 MG tablet Take 1 tablet by mouth every 6 (six) hours as needed. 08/18/23  Yes [provider]  promethazine-dextromethorphan (PROMETHAZINE-DM) 6.25-15 MG/5ML syrup Take 5 mLs by mouth 4 (four) times daily as needed for cough. 09/09/23  Yes Rinaldo Ratel, [place] N, FNP  HYDROcodone-acetaminophen (NORCO) 10-325 MG tablet Take 1 tablet by mouth every 8 (eight) hours as needed.    [provider]  HYDROcodone-homatropine (HYCODAN) 5-1.5 MG/5ML syrup Take 5 mLs by mouth every 8 (eight) hours as needed for cough. 07/04/20   Kallie Locks, FNP  sildenafil (VIAGRA) 100 MG tablet Take 0.5-1 tablets (50-100 mg total) by mouth daily as needed for erectile dysfunction. Patient not taking: Reported on 07/04/2020 06/19/20   Kallie Locks, FNP  simvastatin (ZOCOR) 10 MG tablet TAKE 1 TABLET(10 MG) BY MOUTH AT BEDTIME 08/17/20   Kallie Locks, FNP  Vitamin D, Ergocalciferol, (DRISDOL) 1.25 MG (50000 UNIT) CAPS capsule TAKE 1 CAPSULE BY MOUTH EVERY 7 DAYS 09/26/20   Kallie Locks, FNP    Family History Family History  Problem Relation Age of Onset   Diabetes Mother    Cancer Father    Stomach cancer Father    Esophageal cancer Brother    Liver cancer Brother    Rectal cancer Brother    Colon cancer  Neg Hx    Pancreatic cancer Neg Hx    Colon polyps Neg Hx     Social History Social History   Tobacco Use   Smoking status: Former    Current packs/day: 0.00    Types: Cigarettes    Quit date: 06/27/2016    Years since quitting: 7.2   Smokeless tobacco: Never  Vaping Use   Vaping status: Never Used  Substance Use Topics   Alcohol use: No    Alcohol/week: 0.0 standard drinks of alcohol    Comment: Quit two and a half years ago.   Drug use: No     Allergies   Patient has no known allergies.   Review of Systems Review of Systems  Constitutional:  Negative for fever.  HENT:  Positive for congestion, rhinorrhea and sore throat.   Respiratory:  Positive for cough.  Negative for shortness of breath and wheezing.   Cardiovascular:  Negative for chest pain.  Gastrointestinal:  Negative for abdominal pain and vomiting.     Physical Exam Triage Vital Signs ED Triage Vitals  Encounter Vitals Group     BP 09/09/23 1130 133/81     Systolic BP Percentile --      Diastolic BP Percentile --      Pulse Rate 09/09/23 1130 67     Resp 09/09/23 1130 17     Temp 09/09/23 1130 98 F (36.7 C)     Temp Source 09/09/23 1130 Oral     SpO2 09/09/23 1130 98 %     Weight --      Height --      Head Circumference --      Peak Flow --      Pain Score 09/09/23 1129 0     Pain Loc --      Pain Education --      Exclude from Growth Chart --    No data found.  Updated Vital Signs BP 133/81 (BP Location: Right Arm)   Pulse 67   Temp 98 F (36.7 C) (Oral)   Resp 17   SpO2 98%   Visual Acuity Right Eye Distance:   Left Eye Distance:   Bilateral Distance:    Right Eye Near:   Left Eye Near:    Bilateral Near:     Physical Exam Vitals and nursing note reviewed.  Constitutional:      Appearance: Normal appearance.  HENT:     Head: Normocephalic and atraumatic.     Right Ear: External ear normal.     Left Ear: External ear normal.     Nose: Congestion and rhinorrhea present.     Mouth/Throat:     Mouth: Mucous membranes are moist.     Pharynx: Posterior oropharyngeal erythema present.  Eyes:     Conjunctiva/sclera: Conjunctivae normal.  Cardiovascular:     Rate and Rhythm: Normal rate and regular rhythm.     Heart sounds: Normal heart sounds. No murmur heard. Pulmonary:     Effort: Pulmonary effort is normal. No respiratory distress.     Breath sounds: Normal breath sounds.  Musculoskeletal:        General: Normal range of motion.  Skin:    General: Skin is warm and dry.  Neurological:     General: No focal deficit present.     Mental Status: He is alert and oriented to person, place, and time.  Psychiatric:        Mood and Affect: Mood  normal.  Behavior: Behavior normal.      UC Treatments / Results  Labs (all labs ordered are listed, but only abnormal results are displayed) Labs Reviewed  SARS CORONAVIRUS 2 (TAT 6-24 HRS)    EKG   Radiology No results found.  Procedures Procedures (including critical care time)  Medications Ordered in UC Medications - No data to display  Initial Impression / Assessment and Plan / UC Course  I have reviewed the triage vital signs and the nursing notes.  Pertinent labs & imaging results that were available during my care of the patient were reviewed by me and considered in my medical decision making (see chart for details).  Vitals and triage reviewed, patient is hemodynamically stable.  Heart with regular rate and rhythm, lungs are vesicular.  Nasal congestion, rhinorrhea and posterior pharynx erythema with PND present on physical exam, consistent with viral URI.  COVID-19 testing obtained.  Symptomatic management for viral illness discussed.  Plan of care, follow-up care return precautions given, no questions at this time.     Final Clinical Impressions(s) / UC Diagnoses   Final diagnoses:  Viral URI with cough     Discharge Instructions      Your symptoms appear viral in nature.  We have tested you for COVID-19 and we will contact you if your results are positive.  Please sleep with a humidifier, do warm saline gargles and continue over-the-counter cough drops for your sore throat.  You can use the cough medicine up to 4 times daily as needed, do not drink or drive because it may cause drowsiness.  Ensure you are drinking at least 64 ounces of water in addition to the Mucinex to help loosen up your secretions.  Symptoms should improve over the next 3 to 5 days, if no improvement despite these interventions please return to clinic.  Return to clinic for new or urgent symptoms.      ED Prescriptions     Medication Sig Dispense Auth. Provider    promethazine-dextromethorphan (PROMETHAZINE-DM) 6.25-15 MG/5ML syrup Take 5 mLs by mouth 4 (four) times daily as needed for cough. 118 mL Rinaldo Ratel, [place], FNP   guaiFENesin (MUCINEX) 600 MG 12 hr tablet Take 2 tablets (1,200 mg total) by mouth 2 (two) times daily for 5 days. 20 tablet Papa [place], [place], [place]      PDMP not reviewed this encounter.   Antanette Richwine, [place] N, [place] 09/09/23 [phone]

## 2023-09-09 NOTE — ED Triage Notes (Signed)
Pt reports since Saturday had congestion and cough. Taking Ricola cough drops. Reports goes and sees his mother, and found out she had covid.

## 2023-09-09 NOTE — Discharge Instructions (Addendum)
Your symptoms appear viral in nature.  We have tested you for COVID-19 and we will contact you if your results are positive.  Please sleep with a humidifier, do warm saline gargles and continue over-the-counter cough drops for your sore throat.  You can use the cough medicine up to 4 times daily as needed, do not drink or drive because it may cause drowsiness.  Ensure you are drinking at least 64 ounces of water in addition to the Mucinex to help loosen up your secretions.  Symptoms should improve over the next 3 to 5 days, if no improvement despite these interventions please return to clinic.  Return to clinic for new or urgent symptoms.

## 2023-09-10 LAB — SARS CORONAVIRUS 2 (TAT 6-24 HRS): SARS Coronavirus 2: POSITIVE — AB

## 2024-02-04 ENCOUNTER — Ambulatory Visit (HOSPITAL_COMMUNITY)
Admission: EM | Admit: 2024-02-04 | Discharge: 2024-02-04 | Disposition: A | Discharge status: Home / Self Care | Attending: Emergency Medicine | Admitting: Emergency Medicine

## 2024-02-04 ENCOUNTER — Encounter (HOSPITAL_COMMUNITY): Payer: Self-pay

## 2024-02-04 DIAGNOSIS — B9789 Other viral agents as the cause of diseases classified elsewhere: Secondary | ICD-10-CM

## 2024-02-04 DIAGNOSIS — J988 Other specified respiratory disorders: Secondary | ICD-10-CM | POA: Diagnosis not present

## 2024-02-04 LAB — POC COVID19/FLU A&B COMBO
Covid Antigen, POC: NEGATIVE
Influenza A Antigen, POC: NEGATIVE
Influenza B Antigen, POC: NEGATIVE

## 2024-02-04 NOTE — ED Triage Notes (Signed)
 Chief Complaint: Slight cough, runny nose   Sick exposure: Yes- Mom was sick and positive with COVID  Onset: yesterday   Prescriptions or OTC medications tried: No

## 2024-02-04 NOTE — Discharge Instructions (Signed)
 You tested negative for COVID and flu today. I believe your symptoms are from a viral illness. You can alternate between Tylenol and Ibuprofen as needed for pain and fever. I recommend Mucinex for cough and congestion as needed. Stay hydrated and get plenty of rest. Return here if symptoms persist or worsen.

## 2024-02-04 NOTE — ED Provider Notes (Signed)
 [facility]    CSN: [id number] Arrival date & time: 02/04/24  6213      History   Chief Complaint Chief Complaint  Patient presents with   Covid Exposure    HPI Ruben Rivas is a 63 y.o. male.   Patient presents with mild cough and runny nose that began yesterday.  Denies shortness of breath, chest pain, fever, abdominal pain.  Patient reports exposure to COVID from his mother.     Past Medical History:  Diagnosis Date   Arthritis    Chronic pain of left ankle    Erectile dysfunction 05/2020   GERD (gastroesophageal reflux disease)    Hyperlipidemia 07/2019   Hypertension    Stroke (HCC)    2001   Substance abuse (HCC)    Tuberculosis    treated in 2004   Vitamin D deficiency 07/2019    Patient Active Problem List   Diagnosis Date Noted   Vitamin B12 deficiency 06/22/2020   History of stroke 12/21/2019   Hypertension 12/21/2019   Mixed hyperlipidemia 12/21/2019   Chronic pain of left ankle 12/21/2019   Vitamin D deficiency 12/21/2019   Urinary frequency 12/22/2018   Pain in joint, ankle and foot 02/18/2016    Past Surgical History:  Procedure Laterality Date   ANKLE FRACTURE SURGERY  2001       Home Medications    Prior to Admission medications   Medication Sig Start Date End Date Taking? Authorizing Provider  oxyCODONE-acetaminophen (PERCOCET) 10-325 MG tablet Take 1 tablet by mouth every 6 (six) hours as needed. 08/18/23  Yes [provider]  simvastatin (ZOCOR) 10 MG tablet TAKE 1 TABLET(10 MG) BY MOUTH AT BEDTIME 08/17/20  Yes Kallie Locks, FNP  Vitamin D, Ergocalciferol, (DRISDOL) 1.25 MG (50000 UNIT) CAPS capsule TAKE 1 CAPSULE BY MOUTH EVERY 7 DAYS 09/26/20   Kallie Locks, FNP    Family History Family History  Problem Relation Age of Onset   Diabetes Mother    Cancer Father    Stomach cancer Father    Esophageal cancer Brother    Liver cancer Brother    Rectal cancer Brother    Colon cancer Neg Hx     Pancreatic cancer Neg Hx    Colon polyps Neg Hx     Social History Social History   Tobacco Use   Smoking status: Former    Current packs/day: 0.00    Types: Cigarettes    Quit date: 06/27/2016    Years since quitting: 7.6   Smokeless tobacco: Never  Vaping Use   Vaping status: Never Used  Substance Use Topics   Alcohol use: No    Alcohol/week: 0.0 standard drinks of alcohol    Comment: Quit two and a half years ago.   Drug use: No     Allergies   Patient has no known allergies.   Review of Systems Review of Systems  Per HPI  Physical Exam Triage Vital Signs ED Triage Vitals  Encounter Vitals Group     BP 02/04/24 1033 (!) 140/93     Systolic BP Percentile --      Diastolic BP Percentile --      Pulse Rate 02/04/24 1033 70     Resp 02/04/24 1033 16     Temp 02/04/24 1033 98 F (36.7 C)     Temp Source 02/04/24 1033 Oral     SpO2 02/04/24 1033 96 %     Weight 02/04/24 1033 197 lb (89.4 kg)  Height 02/04/24 1033 6' (1.829 m)     Head Circumference --      Peak Flow --      Pain Score 02/04/24 1032 0     Pain Loc --      Pain Education --      Exclude from Growth Chart --    No data found.  Updated Vital Signs BP (!) 140/93 (BP Location: Left Arm)   Pulse 70   Temp 98 F (36.7 C) (Oral)   Resp 16   Ht 6' (1.829 m)   Wt 197 lb (89.4 kg)   SpO2 96%   BMI 26.72 kg/m   Visual Acuity Right Eye Distance:   Left Eye Distance:   Bilateral Distance:    Right Eye Near:   Left Eye Near:    Bilateral Near:     Physical Exam Vitals and nursing note reviewed.  Constitutional:      General: He is awake. He is not in acute distress.    Appearance: Normal appearance. He is well-developed and well-groomed. He is not ill-appearing.  HENT:     Nose: Congestion and rhinorrhea present.     Mouth/Throat:     Mouth: Mucous membranes are moist.     Pharynx: Oropharynx is clear.  Cardiovascular:     Rate and Rhythm: Normal rate and regular rhythm.   Pulmonary:     Effort: Pulmonary effort is normal.     Breath sounds: Normal breath sounds.  Skin:    General: Skin is warm and dry.  Neurological:     Mental Status: He is alert.  Psychiatric:        Behavior: Behavior is cooperative.      UC Treatments / Results  Labs (all labs ordered are listed, but only abnormal results are displayed) Labs Reviewed  POC COVID19/FLU A&B COMBO    EKG   Radiology No results found.  Procedures Procedures (including critical care time)  Medications Ordered in UC Medications - No data to display  Initial Impression / Assessment and Plan / UC Course  I have reviewed the triage vital signs and the nursing notes.  Pertinent labs & imaging results that were available during my care of the patient were reviewed by me and considered in my medical decision making (see chart for details).     Upon assessment congestion and rhinorrhea are present.  No other significant findings upon exam.  COVID and flu testing negative.  Discussed over-the-counter medications for viral illness related symptoms.  Discussed return precautions Final Clinical Impressions(s) / UC Diagnoses   Final diagnoses:  Viral respiratory illness     Discharge Instructions      You tested negative for COVID and flu today. I believe your symptoms are from a viral illness. You can alternate between Tylenol and Ibuprofen as needed for pain and fever. I recommend Mucinex for cough and congestion as needed. Stay hydrated and get plenty of rest. Return here if symptoms persist or worsen.       ED Prescriptions   None    PDMP not reviewed this encounter.   Wynonia Lawman A, NP 02/04/24 1143

## 2024-07-02 ENCOUNTER — Encounter: Payer: Self-pay | Admitting: Gastroenterology

## 2024-07-19 ENCOUNTER — Encounter: Payer: Self-pay | Admitting: Gastroenterology

## 2024-09-27 ENCOUNTER — Telehealth: Payer: Self-pay | Admitting: Gastroenterology

## 2024-09-27 NOTE — Telephone Encounter (Signed)
 This pt is scheduled for an office visit on 11/4, not a colonoscopy. Please advise.

## 2024-09-27 NOTE — Telephone Encounter (Signed)
 PT needs to have prep medication sent to [facility]. Colonoscopy is on 11/4. Please advise. He will also need instructions

## 2024-10-04 ENCOUNTER — Encounter: Payer: Self-pay | Admitting: Gastroenterology

## 2024-10-04 ENCOUNTER — Ambulatory Visit (INDEPENDENT_AMBULATORY_CARE_PROVIDER_SITE_OTHER): Admitting: Gastroenterology

## 2024-10-04 VITALS — BP 120/90 | HR 86 | Ht 72.0 in | Wt 200.0 lb

## 2024-10-04 DIAGNOSIS — Z860100 Personal history of colon polyps, unspecified: Secondary | ICD-10-CM

## 2024-10-04 MED ORDER — NA SULFATE-K SULFATE-MG SULF 17.5-3.13-1.6 GM/177ML PO SOLN: 1.0000 | Freq: Once | ORAL | 0 refills | Status: AC

## 2024-10-04 NOTE — Patient Instructions (Signed)
 You have been scheduled for a colonoscopy. Please follow written instructions given to you at your visit today.   If you use inhalers (even only as needed), please bring them with you on the day of your procedure.  DO NOT TAKE 7 DAYS PRIOR TO TEST- Trulicity (dulaglutide) Ozempic , Wegovy  (semaglutide ) Mounjaro (tirzepatide) Bydureon Bcise (exanatide extended release)  DO NOT TAKE 1 DAY PRIOR TO YOUR TEST Rybelsus  (semaglutide ) Adlyxin (lixisenatide) Victoza (liraglutide) Byetta (exanatide) ___________________________________________________________________________  Thank you for trusting me with your gastrointestinal care!    Dr. Victory Legrand [facility]

## 2024-10-04 NOTE — Progress Notes (Signed)
 [facility] Consult Note:  History: Ruben Rivas 10/04/2024  Referring provider: Paseda, Folashade R, FNP  Reason for consult/chief complaint: Colon Cancer Screening (Pt was told to come back in 62yrs)   Subjective  Prior history:  Colonoscopy with Dr. Legrand July 20 20-3 subcentimeter tubular adenomas History of colon cancer in his brother   Discussed the use of AI scribe software for clinical note transcription with the patient, who gave verbal consent to proceed.  History of Present Illness Ruben Rivas is a 63 year old male with a history of precancerous colon polyps who presents for a follow-up colonoscopy.  He had three small precancerous polyps removed during a colonoscopy in July 2020.  No current bowel or digestive issues, including no blood in stools, trouble swallowing, nausea, vomiting, or changes in appetite. No upper digestive issues such as frequent heartburn or painful swallowing.  Normal appetite with no weight loss  Family history is significant for colorectal cancer in one brother. He has a large family, originally having eleven brothers and five sisters, with six brothers having passed away from various types of cancer.   ROS:  Review of Systems  Constitutional:  Negative for appetite change and unexpected weight change.  HENT:  Negative for mouth sores and voice change.   Eyes:  Negative for pain and redness.  Respiratory:  Negative for cough and shortness of breath.   Cardiovascular:  Negative for chest pain and palpitations.  Genitourinary:  Negative for dysuria and hematuria.  Musculoskeletal:  Negative for arthralgias and myalgias.  Skin:  Negative for pallor and rash.  Neurological:  Negative for weakness and headaches.  Hematological:  Negative for adenopathy.     Past Medical History: Past Medical History:  Diagnosis Date   Arthritis    Chronic pain of left ankle    Erectile dysfunction 05/2020   GERD  (gastroesophageal reflux disease)    Hyperlipidemia 07/2019   Hypertension    Stroke (HCC)    2001   Substance abuse (HCC)    Tuberculosis    treated in 2004   Vitamin D  deficiency 07/2019     Past Surgical History: Past Surgical History:  Procedure Laterality Date   ANKLE FRACTURE SURGERY  2001     Family History: Family History  Problem Relation Age of Onset   Diabetes Mother    Cancer Father    Stomach cancer Father    Esophageal cancer Brother    Liver cancer Brother    Rectal cancer Brother    Colon cancer Neg Hx    Pancreatic cancer Neg Hx    Colon polyps Neg Hx     Social History: Social History   Socioeconomic History   Marital status: Single    Spouse name: Not on file   Number of children: Not on file   Years of education: Not on file   Highest education level: Not on file  Occupational History   Not on file  Tobacco Use   Smoking status: Former    Current packs/day: 0.00    Types: Cigarettes    Quit date: 06/27/2016    Years since quitting: 8.2   Smokeless tobacco: Never  Vaping Use   Vaping status: Never Used  Substance and Sexual Activity   Alcohol use: No    Alcohol/week: 0.0 standard drinks of alcohol    Comment: Quit two and a half years ago.   Drug use: No   Sexual activity: Yes  Other Topics Concern  Not on file  Social History Narrative   Not on file   Social Drivers of Health   Financial Resource Strain: Not on file  Food Insecurity: Not on file  Transportation Needs: Not on file  Physical Activity: Not on file  Stress: Not on file  Social Connections: Not on file    Allergies: No Known Allergies  Outpatient Meds: Current Outpatient Medications  Medication Sig Dispense Refill   Na Sulfate-K Sulfate-Mg Sulfate concentrate (SUPREP) 17.5-3.13-1.6 GM/177ML SOLN Take 1 kit (354 mLs total) by mouth once for 1 dose. 354 mL 0   oxyCODONE -acetaminophen  (PERCOCET) 10-325 MG tablet Take 1 tablet by mouth every 6 (six) hours as  needed.     simvastatin  (ZOCOR ) 10 MG tablet TAKE 1 TABLET(10 MG) BY MOUTH AT BEDTIME 90 tablet 3   Vitamin D , Ergocalciferol , (DRISDOL ) 1.25 MG (50000 UNIT) CAPS capsule TAKE 1 CAPSULE BY MOUTH EVERY 7 DAYS 12 capsule 5   No current facility-administered medications for this visit.      ___________________________________________________________________ Objective   Exam:  BP (!) 120/90   Pulse 86   Ht 6' (1.829 m)   Wt 200 lb (90.7 kg)   BMI 27.12 kg/m  Wt Readings from Last 3 Encounters:  10/04/24 200 lb (90.7 kg)  02/04/24 197 lb (89.4 kg)  07/04/20 194 lb 6.4 oz (88.2 kg)    General: Well-appearing Eyes: sclera anicteric, no redness ENT: oral mucosa moist without lesions, no cervical or supraclavicular lymphadenopathy.  No loose chipped or broken teeth CV: Regular without appreciable murmur, no JVD, no peripheral edema Resp: clear to auscultation bilaterally, normal RR and effort noted GI: soft, no tenderness, with active bowel sounds. No guarding or palpable organomegaly noted. Skin; warm and dry, no rash or jaundice noted Neuro: awake, alert and oriented x 3. Normal gross motor function and fluent speech   Labs:     Latest Ref Rng & Units 06/19/2020    9:50 AM 03/02/2018   11:21 AM 02/23/2017    4:28 PM  CBC  WBC 3.4 - 10.8 x10E3/uL 4.8  4.5  4.3   Hemoglobin 13.0 - 17.7 g/dL 85.4  86.1  85.0   Hematocrit 37.5 - 51.0 % 43.3  42.1  44.4   Platelets 150 - 450 x10E3/uL 283  288  352       Latest Ref Rng & Units 06/19/2020    9:50 AM 03/02/2018   11:21 AM 02/23/2017    4:28 PM  CMP  Glucose 65 - 99 mg/dL 96  91  89   BUN 6 - 24 mg/dL 12  12  11    Creatinine 0.76 - 1.27 mg/dL 9.11  9.22  9.05   Sodium 134 - 144 mmol/L 138  139  137   Potassium 3.5 - 5.2 mmol/L 4.9  4.5  4.7   Chloride 96 - 106 mmol/L 101  100  101   CO2 20 - 29 mmol/L 24  23  29    Calcium 8.7 - 10.2 mg/dL 9.9  9.5  9.5   Total Protein 6.0 - 8.5 g/dL 7.9  7.4    Total Bilirubin 0.0 - 1.2 mg/dL  0.3  0.3    Alkaline Phos 48 - 121 IU/L 75  68    AST 0 - 40 IU/L 22  27    ALT 0 - 44 IU/L 25  28     Last TSH on file in this EHR was in July 2021    Encounter Diagnosis  Name Primary?   History of colonic polyps Yes    Assessment and Plan Assessment & Plan Personal history of colonic polyps Three precancerous colonic polyps removed in July 2020. Family history of colorectal cancer. - Schedule follow-up colonoscopy to monitor for new polyps. - Ensure bowel preparation for optimal visualization. - Discussed colonoscopy risks, including bleeding and perforation. - The benefits and risks of the planned procedure(s) were described in detail with the patient or (when appropriate) their health care proxy.  Risks were outlined as including, but not limited to, bleeding, infection, perforation, adverse medication reaction leading to cardiac or pulmonary decompensation, pancreatitis (if ERCP).  The limitation of incomplete mucosal visualization was also discussed.  No guarantees or warranties were given.    Thank you for the courtesy of this consult.  Please call me with any questions or concerns.  Victory LITTIE Brand III  CC: Referring provider noted above

## 2024-10-24 ENCOUNTER — Encounter: Payer: Self-pay | Admitting: Gastroenterology

## 2024-10-25 ENCOUNTER — Encounter: Admitting: Gastroenterology

## 2024-11-01 ENCOUNTER — Telehealth: Payer: Self-pay | Admitting: Gastroenterology

## 2024-11-01 NOTE — Telephone Encounter (Signed)
 Spoke with the patient and reviewed his instructions, including clear liquids.

## 2024-11-01 NOTE — Telephone Encounter (Signed)
 Inbound call from patient stating he has a procedure for tomorrow at 2:30pm and is needing help with prep instructions. Patient also stated he ate breakfast this morning at 7am  Requesting a call back  Please advise  Thank you

## 2024-11-02 ENCOUNTER — Ambulatory Visit: Admitting: Gastroenterology

## 2024-11-02 ENCOUNTER — Encounter: Payer: Self-pay | Admitting: Gastroenterology

## 2024-11-02 VITALS — BP 129/89 | HR 70 | Temp 97.7°F | Ht 72.0 in | Wt 200.0 lb

## 2024-11-02 DIAGNOSIS — Z860100 Personal history of colon polyps, unspecified: Secondary | ICD-10-CM

## 2024-11-02 MED ORDER — SODIUM CHLORIDE 0.9 % IV SOLN: 500.0000 mL | INTRAVENOUS | Status: AC

## 2024-11-02 NOTE — Progress Notes (Signed)
 Patient drank up until arrival.  Offered to do his procedure at 4:30 with Dr. Clayburn permission but patient refused and requested to be rescheduled.  Patient rescheduled for procedure with a previsit.

## 2024-12-05 ENCOUNTER — Encounter

## 2024-12-06 ENCOUNTER — Ambulatory Visit: Admitting: *Deleted

## 2024-12-06 VITALS — Ht 72.0 in | Wt 195.0 lb

## 2024-12-06 DIAGNOSIS — Z860100 Personal history of colon polyps, unspecified: Secondary | ICD-10-CM

## 2024-12-06 MED ORDER — NA SULFATE-K SULFATE-MG SULF 17.5-3.13-1.6 GM/177ML PO SOLN: 1.0000 | Freq: Once | ORAL | 0 refills | Status: AC

## 2024-12-06 NOTE — Progress Notes (Signed)
 Pt's name and DOB verified at the beginning of the pre-visit with 2 identifiers  Pt denies any difficulty with ambulating,sitting, laying down or rolling side to side  Pt has no issues moving head neck or swallowing  No egg or soy allergy known to patient   No issues known to pt with past sedation  No FH of Malignant Hyperthermia  Pt is not on home 02   Pt is not on blood thinners   Pt denies issues with constipation   Pt is not on dialysis  Pt denise any abnormal heart rhythms   Pt denies any upcoming cardiac testing  Patient's chart reviewed by Norleen Schillings CNRA prior to pre-visit and patient appropriate for the [facility].  Pre-visit completed and red dot placed by patient's name on their procedure day (on provider's schedule).   Visit by phone  Pt states weight is 195 lb  Pt given  both [facility] main # and MD on call # prior to instructions.  Informed pt to come in at the time discussed and is shown on PV instructions.  Pt instructed to use [employer] or GoodRx for a price reduction on prep  Instructed printed and sent to address verified during call. Informed pt that it may not get to them prior to procedure and that if they can come by and pick them up they can. Pt states he will come by tomorrow 1/7 to pick them up. Told what floor and where to pick them up Pt states he understands. Instructed pt on all aspects of written instructions including med holds clothing to wear and foods to eat and not eat as well as after procedure legal restrictions and to call MD on call if needed.. Pt states understanding. Instructed pt to review instructions again prior to procedure and call main # given if has any questions or any issues. Pt states they will.

## 2024-12-13 ENCOUNTER — Encounter: Payer: Self-pay | Admitting: Gastroenterology

## 2024-12-13 ENCOUNTER — Ambulatory Visit: Admitting: Gastroenterology

## 2024-12-13 VITALS — BP 104/74 | HR 58 | Temp 97.3°F | Resp 14 | Ht 72.0 in | Wt 195.0 lb

## 2024-12-13 DIAGNOSIS — K5730 Diverticulosis of large intestine without perforation or abscess without bleeding: Secondary | ICD-10-CM

## 2024-12-13 DIAGNOSIS — Z800 Family history of malignant neoplasm of digestive organs: Secondary | ICD-10-CM | POA: Diagnosis not present

## 2024-12-13 DIAGNOSIS — Z860100 Personal history of colon polyps, unspecified: Secondary | ICD-10-CM

## 2024-12-13 DIAGNOSIS — Z860101 Personal history of adenomatous and serrated colon polyps: Secondary | ICD-10-CM | POA: Diagnosis not present

## 2024-12-13 DIAGNOSIS — Z1211 Encounter for screening for malignant neoplasm of colon: Secondary | ICD-10-CM | POA: Diagnosis not present

## 2024-12-13 MED ORDER — SODIUM CHLORIDE 0.9 % IV SOLN: 500.0000 mL | INTRAVENOUS | Status: DC

## 2024-12-13 NOTE — Progress Notes (Signed)
 History and Physical:  This patient presents for endoscopic testing for: Encounter Diagnoses  Name Primary?   Hx of colonic polyps Yes   Family history of colon cancer     64 year old man here today for surveillance colonoscopy with a history of colon polyps.  He also has a family history of colorectal cancer in a brother. 3 subcentimeter tubular adenomas removed during last colonoscopy July 2020.  Patient was here for a colonoscopy on 11/02/2024 but was canceled because of his preprocedure n.p.o. status.  (See notes from that day)  Patient is otherwise without complaints or active issues today.   Past Medical History: Past Medical History:  Diagnosis Date   Arthritis    Chronic pain of left ankle    Erectile dysfunction 05/2020   GERD (gastroesophageal reflux disease)    Hyperlipidemia 07/2019   Hypertension    Stroke (HCC)    2001   Substance abuse (HCC)    Tuberculosis    treated in 2004   Vitamin D  deficiency 07/2019     Past Surgical History: Past Surgical History:  Procedure Laterality Date   ANKLE FRACTURE SURGERY  12/02/1999   COLONOSCOPY      Allergies: Allergies[1]  Outpatient Meds: Current Outpatient Medications  Medication Sig Dispense Refill   cholecalciferol (VITAMIN D3) 25 MCG (1000 UNIT) tablet Take 2,000 Units by mouth daily.     oxyCODONE -acetaminophen  (PERCOCET) 10-325 MG tablet Take 1 tablet by mouth every 6 (six) hours as needed.     simvastatin  (ZOCOR ) 10 MG tablet TAKE 1 TABLET(10 MG) BY MOUTH AT BEDTIME 90 tablet 3   Vitamin D , Ergocalciferol , (DRISDOL ) 1.25 MG (50000 UNIT) CAPS capsule TAKE 1 CAPSULE BY MOUTH EVERY 7 DAYS (Patient not taking: Reported on 12/13/2024) 12 capsule 5   Current Facility-Administered Medications  Medication Dose Route Frequency Provider Last Rate Last Admin   0.9 %  sodium chloride  infusion  500 mL Intravenous Continuous Danis, Victory CROME III, MD           ___________________________________________________________________ Objective   Exam:  BP (!) 145/95   Pulse 60   Temp (!) 97.3 F (36.3 C)   Ht 6' (1.829 m)   Wt 195 lb (88.5 kg)   SpO2 98%   BMI 26.45 kg/m   CV: regular , S1/S2 Resp: clear to auscultation bilaterally, normal RR and effort noted GI: soft, no tenderness, with active bowel sounds.   Assessment: Encounter Diagnoses  Name Primary?   Hx of colonic polyps Yes   Family history of colon cancer      Plan: Colonoscopy   The benefits and risks of the planned procedure(s) were described in detail with the patient or (when appropriate) their health care proxy.  Risks were outlined as including, but not limited to, bleeding, infection, perforation, adverse medication reaction leading to cardiac or pulmonary decompensation, pancreatitis (if ERCP).  The limitation of incomplete mucosal visualization was also discussed.  No guarantees or warranties were given.  The patient was provided an opportunity to ask questions and all were answered. The patient agreed with the plan.   The patient is appropriate for an endoscopic procedure in the ambulatory setting.   - Victory Brand, MD        [1] No Known Allergies

## 2024-12-13 NOTE — Op Note (Signed)
 [facility] Patient Name: Ruben Rivas Procedure Date: 12/13/2024 10:54 AM MRN: [id number] Endoscopist: Victory L. Legrand , MD, [id number] Age: 64 Referring MD:  Date of Birth: 07-31-1961 Gender: Male Account #: [id number] Procedure:                Colonoscopy Indications:              Colon cancer screening in patient at increased                            risk: Colorectal cancer in brother ; Personal                            history of colonic polyps - 3 SubCM TA July 2020 Medicines:                Monitored Anesthesia Care Procedure:                Pre-Anesthesia Assessment:                           - Prior to the procedure, a History and Physical                            was performed, and patient medications and                            allergies were reviewed. The patient's tolerance of                            previous anesthesia was also reviewed. The risks                            and benefits of the procedure and the sedation                            options and risks were discussed with the patient.                            All questions were answered, and informed consent                            was obtained. Prior Anticoagulants: The patient has                            taken no anticoagulant or antiplatelet agents. ASA                            Grade Assessment: II - A patient with mild systemic                            disease. After reviewing the risks and benefits,                            the patient was deemed in satisfactory condition to  undergo the procedure.                           After obtaining informed consent, the colonoscope                            was passed under direct vision. Throughout the                            procedure, the patient's blood pressure, pulse, and                            oxygen saturations were monitored continuously. The                            Olympus Scope  [id number] was introduced through the                            anus and advanced to the the cecum, identified by                            appendiceal orifice and ileocecal valve. The                            colonoscopy was performed without difficulty. The                            patient tolerated the procedure well. The quality                            of the bowel preparation was excellent. The                            ileocecal valve, appendiceal orifice, and rectum                            were photographed. Scope In: 11:08:36 AM Scope Out: 11:18:58 AM Scope Withdrawal Time: 0 hours 8 minutes 19 seconds  Total Procedure Duration: 0 hours 10 minutes 22 seconds  Findings:                 The perianal and digital rectal examinations were                            normal.                           Repeat examination of right colon under NBI                            performed.                           Multiple diverticula were found in the left colon.  The exam was otherwise without abnormality on                            direct and retroflexion views. Complications:            No immediate complications. Estimated Blood Loss:     Estimated blood loss: none. Impression:               - Diverticulosis in the left colon.                           - The examination was otherwise normal on direct                            and retroflexion views.                           - No specimens collected. Recommendation:           - Patient has a contact number available for                            emergencies. The signs and symptoms of potential                            delayed complications were discussed with the                            patient. Return to normal activities tomorrow.                            Written discharge instructions were provided to the                            patient.                           - Resume previous  diet.                           - Continue present medications.                           - Repeat colonoscopy in 5 years for screening                            purposes( fam Hx CRC ). Sadik Piascik L. Legrand, MD 12/13/2024 11:22:51 AM This report has been signed electronically.

## 2024-12-13 NOTE — Patient Instructions (Signed)
 Resume previous diet and medications. Repeat Colonoscopy in 5 years for surveillance purposes. Handout provided on Diverticulosis  YOU HAD AN ENDOSCOPIC PROCEDURE TODAY AT THE [facility]:   Refer to the procedure report that was given to you for any specific questions about what was found during the examination.  If the procedure report does not answer your questions, please call your gastroenterologist to clarify.  If you requested that your care partner not be given the details of your procedure findings, then the procedure report has been included in a sealed envelope for you to review at your convenience later.  YOU SHOULD EXPECT: Some feelings of bloating in the abdomen. Passage of more gas than usual.  Walking can help get rid of the air that was put into your GI tract during the procedure and reduce the bloating. If you had a lower endoscopy (such as a colonoscopy or flexible sigmoidoscopy) you may notice spotting of blood in your stool or on the toilet paper. If you underwent a bowel prep for your procedure, you may not have a normal bowel movement for a few days.  Please Note:  You might notice some irritation and congestion in your nose or some drainage.  This is from the oxygen used during your procedure.  There is no need for concern and it should clear up in a day or so.  SYMPTOMS TO REPORT IMMEDIATELY:  Following lower endoscopy (colonoscopy or flexible sigmoidoscopy):  Excessive amounts of blood in the stool  Significant tenderness or worsening of abdominal pains  Swelling of the abdomen that is new, acute  Fever of 100F or higher  For urgent or emergent issues, a gastroenterologist can be reached at any hour by calling [phone]. Do not use MyChart messaging for urgent concerns.    DIET:  We do recommend a small meal at first, but then you may proceed to your regular diet.  Drink plenty of fluids but you should avoid alcoholic beverages for 24  hours.  ACTIVITY:  You should plan to take it easy for the rest of today and you should NOT DRIVE or use heavy machinery until tomorrow (because of the sedation medicines used during the test).    FOLLOW UP: Our staff will call the number listed on your records the next business day following your procedure.  We will call around 7:15- 8:00 am to check on you and address any questions or concerns that you may have regarding the information given to you following your procedure. If we do not reach you, we will leave a message.     If any biopsies were taken you will be contacted by phone or by letter within the next 1-3 weeks.  Please call us  at [phone] if you have not heard about the biopsies in 3 weeks.    SIGNATURES/CONFIDENTIALITY: You and/or your care partner have signed paperwork which will be entered into your electronic medical record.  These signatures attest to the fact that that the information above on your After Visit Summary has been reviewed and is understood.  Full responsibility of the confidentiality of this discharge information lies with you and/or your care-partner.

## 2024-12-13 NOTE — Progress Notes (Signed)
 Sedate, gd SR, tolerated procedure well, VSS, report to RN

## 2024-12-14 ENCOUNTER — Telehealth: Payer: Self-pay

## 2024-12-14 NOTE — Telephone Encounter (Signed)
" °  Follow up Call-     12/13/2024   10:33 AM 11/02/2024    2:37 PM  Call back number  Post procedure Call Back phone  # [phone]  Permission to leave phone message Yes Yes     Patient questions:  Do you have a fever, pain , or abdominal swelling? No. Pain Score  0 *  Have you tolerated food without any problems? Yes.    Have you been able to return to your normal activities? Yes.    Do you have any questions about your discharge instructions: Diet   No. Medications  No. Follow up visit  No.  Do you have questions or concerns about your Care? No.  Actions: * If pain score is 4 or above: No action needed, pain <4.   "
